# Patient Record
Sex: Female | Born: 1995 | Race: White | Hispanic: No | Marital: Single | Smoking: Former smoker
Health system: Southern US, Community
[De-identification: ages and names within clinical notes are randomized; demographics above are authoritative.]

## PROBLEM LIST (undated history)

## (undated) DIAGNOSIS — O139 Gestational [pregnancy-induced] hypertension without significant proteinuria, unspecified trimester: Secondary | ICD-10-CM

## (undated) DIAGNOSIS — R319 Hematuria, unspecified: Secondary | ICD-10-CM

## (undated) DIAGNOSIS — O00102 Left tubal pregnancy without intrauterine pregnancy: Secondary | ICD-10-CM

## (undated) DIAGNOSIS — I10 Essential (primary) hypertension: Secondary | ICD-10-CM

## (undated) HISTORY — DX: Left tubal pregnancy without intrauterine pregnancy: O00.102

## (undated) HISTORY — PX: TONSILLECTOMY: SUR1361

---
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
## 2015-01-16 ENCOUNTER — Encounter (HOSPITAL_COMMUNITY): Payer: Self-pay | Admitting: Emergency Medicine

## 2015-01-16 ENCOUNTER — Emergency Department (HOSPITAL_COMMUNITY)
Admission: EM | Admit: 2015-01-16 | Discharge: 2015-01-16 | Discharge status: Against Medical Advice | Payer: BLUE CROSS/BLUE SHIELD | Attending: Emergency Medicine | Admitting: Emergency Medicine

## 2015-01-16 ENCOUNTER — Emergency Department (HOSPITAL_COMMUNITY): Payer: Self-pay

## 2015-01-16 DIAGNOSIS — R102 Pelvic and perineal pain: Secondary | ICD-10-CM | POA: Insufficient documentation

## 2015-01-16 DIAGNOSIS — F17210 Nicotine dependence, cigarettes, uncomplicated: Secondary | ICD-10-CM | POA: Insufficient documentation

## 2015-01-16 DIAGNOSIS — O99331 Smoking (tobacco) complicating pregnancy, first trimester: Secondary | ICD-10-CM | POA: Insufficient documentation

## 2015-01-16 DIAGNOSIS — O9989 Other specified diseases and conditions complicating pregnancy, childbirth and the puerperium: Secondary | ICD-10-CM | POA: Insufficient documentation

## 2015-01-16 DIAGNOSIS — Z3A00 Weeks of gestation of pregnancy not specified: Secondary | ICD-10-CM | POA: Insufficient documentation

## 2015-01-16 NOTE — ED Notes (Signed)
Patient not found in waiting area. Per registration, patient left department

## 2015-01-16 NOTE — ED Notes (Signed)
Patient with positive home pregnancy test 3 days ago. Patient states vaginal bleeding and lower abdominal cramping that started today. Small to moderate amount of dark blood noted today.

## 2015-02-04 ENCOUNTER — Other Ambulatory Visit: Payer: Self-pay | Admitting: Obstetrics and Gynecology

## 2015-02-04 DIAGNOSIS — O3680X0 Pregnancy with inconclusive fetal viability, not applicable or unspecified: Secondary | ICD-10-CM

## 2015-02-05 ENCOUNTER — Encounter: Payer: Self-pay | Admitting: Obstetrics & Gynecology

## 2015-02-05 ENCOUNTER — Other Ambulatory Visit: Payer: Self-pay

## 2015-02-09 ENCOUNTER — Encounter (HOSPITAL_COMMUNITY): Payer: Self-pay | Admitting: Emergency Medicine

## 2015-02-09 ENCOUNTER — Emergency Department (HOSPITAL_COMMUNITY)
Admission: EM | Admit: 2015-02-09 | Discharge: 2015-02-09 | Disposition: A | Discharge status: Home / Self Care | Payer: BLUE CROSS/BLUE SHIELD | Attending: Emergency Medicine | Admitting: Emergency Medicine

## 2015-02-09 DIAGNOSIS — Z91040 Latex allergy status: Secondary | ICD-10-CM | POA: Insufficient documentation

## 2015-02-09 DIAGNOSIS — Z720 Tobacco use: Secondary | ICD-10-CM | POA: Insufficient documentation

## 2015-02-09 DIAGNOSIS — R21 Rash and other nonspecific skin eruption: Secondary | ICD-10-CM | POA: Insufficient documentation

## 2015-02-09 LAB — HCG, QUANTITATIVE, PREGNANCY: hCG, Beta Chain, Quant, S: 1 m[IU]/mL (ref <5)

## 2015-02-09 MED ORDER — DEXAMETHASONE 4 MG PO TABS: 4.0000 mg | ORAL_TABLET | Freq: Two times a day (BID) | ORAL | Status: DC

## 2015-02-09 MED ORDER — DOXYCYCLINE HYCLATE 100 MG PO CAPS: 100.0000 mg | ORAL_CAPSULE | Freq: Two times a day (BID) | ORAL | Status: DC

## 2015-02-09 NOTE — Discharge Instructions (Signed)
Please use Decadron and doxycycline 2 times daily with food for your rash.please see your local dermatologist if not improving.  Your quantitative hCG was 1, not consistent with pregnancy. Please see your GYN to make sure that there are no products of conception left in your reproductive system.

## 2015-02-09 NOTE — ED Notes (Signed)
Patient complaining of generalized rash. States "my whole house has had it. They said it was a staph infection."

## 2015-02-09 NOTE — ED Provider Notes (Signed)
CSN: [id number]     Arrival date & time 02/09/15  1041 History  By signing my name below, I, [name], attest that this documentation has been prepared under the direction and in the presence of [name], PA-C  Electronically Signed: [name], ED Scribe. 02/09/2015. 12:09 PM.   Chief Complaint  Patient presents with  . Rash   Patient is a 19 y.o. female presenting with rash. The history is provided by the patient. No language interpreter was used.  Rash Location:  Leg and face Facial rash location:  Face Leg rash location:  L leg and R leg Quality: redness   Severity:  Mild Onset quality:  Gradual Duration:  3 weeks Timing:  Intermittent Progression:  Unchanged Chronicity:  Recurrent Context: exposure to similar rash   Relieved by:  Nothing Worsened by:  Nothing tried Ineffective treatments:  None tried Associated symptoms: no fever     HPI Comments: Jennifer Harding is a 19 y.o. female who presents to the Emergency Department complaining of a recurrent, mild rash on her bilateral legs and face that started 3 weeks ago, resolved and returned a few days ago. Her roommates have similar rash and told her it was a staph infection. One of her roommates was diagnosed with MRSA. Pt denies fever.   Pt was seen seen in the [facility] ED last month for vaginal bleeding after she had a positive pregnancy bleeding. She was told she had a miscarriage. Pt reports that her bleeding has stopped and she has since had negative pregnancy tests. She is requesting confirmation of pregnancy today.  No PCP  Past Medical History  Diagnosis Date  . Hematuria    Past Surgical History  Procedure Laterality Date  . Tonsillectomy     History reviewed. No pertinent family history. Social History  Substance Use Topics  . Smoking status: Current Every Day Smoker -- 1.00 packs/day for 4 years    Types: Cigarettes  . Smokeless tobacco: Former Neurosurgeon  . Alcohol Use: No   OB History     Gravida Para Term Preterm AB TAB SAB Ectopic Multiple Living   1              Review of Systems  Constitutional: Negative for fever.  Genitourinary: Negative for vaginal bleeding.  Skin: Positive for rash.  All other systems reviewed and are negative.  Allergies  Latex  Home Medications   Prior to Admission medications   Medication Sig Start Date End Date Taking? Authorizing Provider  guaiFENesin (MUCINEX) 600 MG 12 hr tablet Take 600 mg by mouth 2 (two) times daily.    Historical Provider, MD  ibuprofen (ADVIL,MOTRIN) 600 MG tablet Take 1 tablet (600 mg total) by mouth every 6 (six) hours as needed. 08/21/13   Deirdre C Poe, CNM   BP 121/71 mmHg  Pulse 82  Temp(Src) 97.7 F (36.5 C) (Oral)  Resp 16  Ht  (1.702 m)  Wt 180 lb (81.647 kg)  BMI 28.19 kg/m2  SpO2 99%  LMP 01/28/2015 Physical Exam  Constitutional: She appears well-developed and well-nourished. No distress.  HENT:  Head: Normocephalic and atraumatic.  Eyes: Conjunctivae and EOM are normal.  Neck: Neck supple. No tracheal deviation present.  Cardiovascular: Normal rate.   Pulmonary/Chest: Effort normal. No respiratory distress.  Musculoskeletal:  The dorsalis pedis is 2+ bilaterally Capillary refill less than 2 s  Skin: Skin is warm and dry.  Multiple papular red areas below the knee extending to just above the  ankle, some with scabbed top No red streaks The areas are not hot No satellite abscess areas Palms are spared No lesions in the web spaces between the fingers Few raised red papular areas on the upper extremities No red streaks noted No hot areas about the face No red streaks Red raised areas on the upper chest and face No lesions on the back Papular lesions of the left posterior thigh and buttock  Psychiatric: She has a normal mood and affect. Her behavior is normal.  Nursing note and vitals reviewed.   ED Course  Procedures   DIAGNOSTIC STUDIES: Oxygen Saturation is 99% on RA,  normal by my interpretation.    COORDINATION OF CARE: 12:08 PM Discussed treatment plan with pt which includes hCG pregnancy, follow-up with GYN, oral antibiotics and Bactroban. Pt agreed to plan.  Labs Review Labs Reviewed  HCG, QUANTITATIVE, PREGNANCY   I have personally reviewed and evaluated these lab results as part of my medical decision-making.  MDM  Vital signs are well within normal limits. The patient states that she has been exposed to a rash that other members in the house have also had. One of them has been diagnosed with methicillin-resistant staph. The patient will be treated with doxycycline and Decadron. She will follow-up with dermatology if not improving.  The patient had concerns for a threatened miscarriage a few weeks ago. She request to be tested for her pregnancy. Her quantitative hCG was 1. I have discussed the results with the patient in terms which he understands.    Final diagnoses:  None    **I have reviewed nursing notes, vital signs, and all appropriate lab and imaging results for this patient.*  **I personally performed the services described in this documentation, which was scribed in my presence. The recorded information has been reviewed and is accurate.[name], PA-C 02/09/15 1333  Mancel Bale, MD 02/09/15 [phone]

## 2015-05-26 ENCOUNTER — Other Ambulatory Visit: Payer: Self-pay

## 2015-05-27 ENCOUNTER — Ambulatory Visit (INDEPENDENT_AMBULATORY_CARE_PROVIDER_SITE_OTHER): Payer: Medicaid Other

## 2015-05-27 ENCOUNTER — Other Ambulatory Visit: Payer: Self-pay | Admitting: Obstetrics and Gynecology

## 2015-05-27 DIAGNOSIS — O3680X0 Pregnancy with inconclusive fetal viability, not applicable or unspecified: Secondary | ICD-10-CM

## 2015-05-27 NOTE — Progress Notes (Addendum)
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 Emelda Fear.

## 2015-05-28 ENCOUNTER — Telehealth: Payer: Self-pay | Admitting: Obstetrics & Gynecology

## 2015-05-28 NOTE — Telephone Encounter (Signed)
Pt states had initial ultrasound yesterday about [redacted] weeks pregnant, had light bleeding last night into this am, no pain. Vaginal bleeding has stopped.  Pt informed "Subchorionic Hemorrhage" on ultrasound could have been what caused the bleeding to keep her appt for 06/04/2015. Pt advised if heavy bleeding occurs or pain to go to ER. Pt verbalized understanding.

## 2015-05-28 NOTE — Telephone Encounter (Signed)
Pt called stating that she would like a call back from the nurse regarding the ultrasound that she has yesterday. Please contact pt

## 2015-05-29 ENCOUNTER — Inpatient Hospital Stay (HOSPITAL_COMMUNITY)
Admission: AD | Admit: 2015-05-29 | Discharge: 2015-05-29 | Disposition: A | Discharge status: Home / Self Care | Payer: Medicaid Other | Source: Ambulatory Visit | Attending: Obstetrics and Gynecology | Admitting: Obstetrics and Gynecology

## 2015-05-29 ENCOUNTER — Inpatient Hospital Stay (HOSPITAL_COMMUNITY): Payer: Medicaid Other

## 2015-05-29 ENCOUNTER — Encounter (HOSPITAL_COMMUNITY): Payer: Self-pay

## 2015-05-29 DIAGNOSIS — O99011 Anemia complicating pregnancy, first trimester: Secondary | ICD-10-CM | POA: Diagnosis not present

## 2015-05-29 DIAGNOSIS — Z3A01 Less than 8 weeks gestation of pregnancy: Secondary | ICD-10-CM | POA: Diagnosis not present

## 2015-05-29 DIAGNOSIS — O034 Incomplete spontaneous abortion without complication: Secondary | ICD-10-CM | POA: Diagnosis not present

## 2015-05-29 DIAGNOSIS — O99331 Smoking (tobacco) complicating pregnancy, first trimester: Secondary | ICD-10-CM | POA: Insufficient documentation

## 2015-05-29 DIAGNOSIS — N939 Abnormal uterine and vaginal bleeding, unspecified: Secondary | ICD-10-CM | POA: Diagnosis present

## 2015-05-29 LAB — CBC
HCT: 31.6 % — ABNORMAL LOW (ref 36.0–46.0)
Hemoglobin: 10.5 g/dL — ABNORMAL LOW (ref 12.0–15.0)
MCH: 29.2 pg (ref 26.0–34.0)
MCHC: 33.2 g/dL (ref 30.0–36.0)
MCV: 88 fL (ref 78.0–100.0)
PLATELETS: 337 10*3/uL (ref 150–400)
RBC: 3.59 MIL/uL — AB (ref 3.87–5.11)
RDW: 13.5 % (ref 11.5–15.5)
WBC: 21.3 10*3/uL — AB (ref 4.0–10.5)

## 2015-05-29 MED ORDER — IBUPROFEN 600 MG PO TABS: 600.0000 mg | ORAL_TABLET | Freq: Four times a day (QID) | ORAL | Status: DC | PRN

## 2015-05-29 MED ORDER — OXYCODONE-ACETAMINOPHEN 5-325 MG PO TABS: 1.0000 | ORAL_TABLET | ORAL | Status: DC | PRN

## 2015-05-29 MED ORDER — IBUPROFEN 600 MG PO TABS
600.0000 mg | ORAL_TABLET | Freq: Once | ORAL | Status: AC
  Administered 2015-05-29: 600 mg via ORAL
  Filled 2015-05-29: qty 1

## 2015-05-29 MED ORDER — MISOPROSTOL 200 MCG PO TABS
800.0000 ug | ORAL_TABLET | Freq: Once | ORAL | Status: AC
  Administered 2015-05-29: 800 ug via ORAL
  Filled 2015-05-29: qty 4

## 2015-05-29 MED ORDER — OXYCODONE-ACETAMINOPHEN 5-325 MG PO TABS
1.0000 | ORAL_TABLET | Freq: Once | ORAL | Status: AC
  Administered 2015-05-29: 1 via ORAL
  Filled 2015-05-29: qty 1

## 2015-05-29 MED ORDER — ONDANSETRON 8 MG PO TBDP
8.0000 mg | ORAL_TABLET | Freq: Once | ORAL | Status: AC
  Administered 2015-05-29: 8 mg via ORAL
  Filled 2015-05-29: qty 1

## 2015-05-29 NOTE — Discharge Instructions (Signed)
Incomplete Miscarriage A miscarriage is the sudden loss of an unborn baby (fetus) before the 20th week of pregnancy. In an incomplete miscarriage, parts of the fetus or placenta (afterbirth) remain in the body.  Having a miscarriage can be an emotional experience. Talk with your health care provider about any questions you may have about miscarrying, the grieving process, and your future pregnancy plans. CAUSES   Problems with the fetal chromosomes that make it impossible for the baby to develop normally. Problems with the baby's genes or chromosomes are most often the result of errors that occur by chance as the embryo divides and grows. The problems are not inherited from the parents.  Infection of the cervix or uterus.  Hormone problems.  Problems with the cervix, such as having an incompetent cervix. This is when the tissue in the cervix is not strong enough to hold the pregnancy.  Problems with the uterus, such as an abnormally shaped uterus, uterine fibroids, or congenital abnormalities.  Certain medical conditions.  Smoking, drinking alcohol, or taking illegal drugs.  Trauma. SYMPTOMS   Vaginal bleeding or spotting, with or without cramps or pain.  Pain or cramping in the abdomen or lower back.  Passing fluid, tissue, or blood clots from the vagina. DIAGNOSIS  Your health care provider will perform a physical exam. You may also have an ultrasound to confirm the miscarriage. Blood or urine tests may also be ordered. TREATMENT   Usually, a dilation and curettage (D&C) procedure is performed. During a D&C procedure, the cervix is widened (dilated) and any remaining fetal or placental tissue is gently removed from the uterus.  Antibiotic medicines are prescribed if there is an infection. Other medicines may be given to reduce the size of the uterus (contract) if there is a lot of bleeding.  If you have Rh negative blood and your baby was Rh positive, you will need a Rho (D)  immune globulin shot. This shot will protect any future baby from having Rh blood problems in future pregnancies.  You may be confined to bed rest. This means you should stay in bed and only get up to use the bathroom. HOME CARE INSTRUCTIONS   Rest as directed by your health care provider.  Restrict activity as directed by your health care provider. You may be allowed to continue light activity if curettage was not done but you require further treatment.  Keep track of the number of pads you use each day. Keep track of how soaked (saturated) they are. Record this information.  Do not  use tampons.  Do not douche or have sexual intercourse until approved by your health care provider.  Keep all follow-up appointments for reevaluation and continuing management.  Only take over-the-counter or prescription medicines for pain, fever, or discomfort as directed by your health care provider.  Take antibiotic medicine as directed by your health care provider. Make sure you finish it even if you start to feel better. SEEK IMMEDIATE MEDICAL CARE IF:   You experience severe cramps in your stomach, back, or abdomen.  You have an unexplained temperature (make sure to record these temperatures).  You pass large clots or tissue (save these for your health care provider to inspect).  Your bleeding increases.  You become light-headed, weak, or have fainting episodes. MAKE SURE YOU:   Understand these instructions.  Will watch your condition.  Will get help right away if you are not doing well or get worse.   This information is not intended to   replace advice given to you by your health care provider. Make sure you discuss any questions you have with your health care provider.   Document Released: 04/25/2005 Document Revised: 05/16/2014 Document Reviewed: 11/22/2012 Elsevier Interactive Patient Education 2016 Elsevier Inc.  

## 2015-05-29 NOTE — MAU Provider Note (Signed)
Chief Complaint: Vaginal Bleeding   First Provider Initiated Contact with Patient 05/29/15 1945        SUBJECTIVE  HPI HPI: Jennifer Harding is a 20 y.o. G1P0 at [redacted]w[redacted]d by LMP who presents to maternity admissions reporting heavy bleeding with miscarriage.  Went to [facility] and they tried to send her to [facility], who sent her here. .She got IV fluids and labs there. She denies vaginal itching/burning, urinary symptoms, h/a, dizziness, n/v, or fever/chills.     Past Medical History  Diagnosis Date  . Hematuria    Past Surgical History  Procedure Laterality Date  . Tonsillectomy     Social History   Social History  . Marital Status: Single    Spouse Name: N/A  . Number of Children: N/A  . Years of Education: N/A   Occupational History  . Not on file.   Social History Main Topics  . Smoking status: Current Every Day Smoker -- 1.00 packs/day for 4 years    Types: Cigarettes  . Smokeless tobacco: Former Neurosurgeon  . Alcohol Use: No  . Drug Use: No  . Sexual Activity: Yes    Birth Control/ Protection: None   Other Topics Concern  . Not on file   Social History Narrative   No current facility-administered medications on file prior to encounter.   Current Outpatient Prescriptions on File Prior to Encounter  Medication Sig Dispense Refill  . dexamethasone (DECADRON) 4 MG tablet Take 1 tablet (4 mg total) by mouth 2 (two) times daily with a meal. (Patient not taking: Reported on 05/29/2015) 12 tablet 0   Allergies  Allergen Reactions  . Latex Rash    ROS:  Review of Systems Review of Systems  Constitutional: Negative for fever and chills.  Gastrointestinal: Negative for nausea, vomiting, abdominal pain, diarrhea and constipation.  Genitourinary: Negative for dysuria. + for vaginal bleeidng and pelvic pain Musculoskeletal: Negative for back pain.  Neurological: Negative for dizziness and weakness.  I have reviewed patient's Past Medical Hx, Surgical Hx, Family  Hx, Social Hx, medications and allergies.   Physical Exam  Patient Vitals for the past 24 hrs:  BP Temp Temp src Pulse Resp  05/29/15 1930 121/73 mmHg 98.6 F (37 C) Oral 102 18    Physical Exam  Constitutional: Well-developed, well-nourished female in no acute distress.  Cardiovascular: normal rate and rhythm Respiratory: normal effort GI: Abd soft, non-tender. Pos BS x 4 MS: Extremities nontender, no edema, normal ROM Neurologic: Alert and oriented x 4.  GU: Neg CVAT.  PELVIC EXAM: Cervix 1/long/high, firm, anterior, neg CMT, uterus tender, nonenlarged, adnexa without tenderness, enlargement, or mass        Small to moderate vaginal bleeding now   LAB RESULTS Results for orders placed or performed during the hospital encounter of 05/29/15 (from the past 24 hour(s))  ABO/Rh     Status: None (Preliminary result)   Collection Time: 05/29/15  8:00 PM  Result Value Ref Range   ABO/RH(D) O POS   CBC     Status: Abnormal   Collection Time: 05/29/15  8:00 PM  Result Value Ref Range   WBC 21.3 (H) 4.0 - 10.5 K/uL   RBC 3.59 (L) 3.87 - 5.11 MIL/uL   Hemoglobin 10.5 (L) 12.0 - 15.0 g/dL   HCT 16.1 (L) 09.6 - 04.5 %   MCV 88.0 78.0 - 100.0 fL   MCH 29.2 26.0 - 34.0 pg   MCHC 33.2 30.0 - 36.0 g/dL  RDW 13.5 11.5 - 15.5 %   Platelets 337 150 - 400 K/uL   Hgb was 12 at other ED.    IMAGING US Ob Comp Less 14 Wks  05/27/2015  DATING AND VIABILITY SONOGRAM Jennifer Harding is a 20 y.o. year old G1P0 with LMP 03/04/2015 which would correlate to  [redacted] weeks gestation.  She has irregular menstrual cycles.   She is here today for a confirmatory initial sonogram. GESTATION: SINGLETON   FETAL ACTIVITY:          Heart rate         No fht seen     CERVIX: Appears closed ADNEXA: The ovaries are normal. GESTATIONAL AGE AND  BIOMETRICS: Gestational criteria: Estimated Date of Delivery: 12/09/2015. 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 Emelda Fear. A copy of this report including all images has been saved and backed up to a second source for retrieval if needed. All measures and details of the anatomical scan, placentation, fluid volume and pelvic anatomy are contained in that report. Jennifer Harding 05/27/2015 2:19 PM   US Ob Transvaginal  05/29/2015  CLINICAL DATA:  Bleeding, first trimester pregnancy, evaluate for viability EXAM: TRANSVAGINAL OB ULTRASOUND TECHNIQUE: Transvaginal ultrasound was performed for complete evaluation of the gestation as well as the maternal uterus, adnexal regions, and pelvic cul-de-sac. COMPARISON:  05/27/2015 FINDINGS: Intrauterine gestational sac: Single gestational sac identified within the cervical canal Yolk sac:  Yolk sac identified within the gestational sac Embryo:  None Cardiac Activity: None CRL:   16  mm   6 w 3 d Subchorionic hemorrhage:  None visualized. Maternal uterus/adnexae: Right ovary appears normal. Left ovary not visualized. IMPRESSION: Gestational sac identified within the cervical canal consistent with abortion in progress. Electronically Signed   By: Esperanza Heir M.D.   On: 05/29/2015 21:44   US Ob Transvaginal  05/27/2015  DATING AND VIABILITY SONOGRAM Jennifer Harding is a 20 y.o. year old G1P0 with LMP 03/04/2015 which would correlate to  [redacted] weeks gestation.  She has irregular menstrual cycles.   She is here today for a confirmatory initial sonogram. GESTATION: SINGLETON   FETAL ACTIVITY:          Heart rate         No fht seen     CERVIX: Appears closed ADNEXA: The ovaries are normal. GESTATIONAL  AGE AND  BIOMETRICS: Gestational criteria: Estimated Date of Delivery: 12/09/2015. 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 Emelda Fear. A copy of this report including all images has been saved and backed up to a second source for retrieval if needed. All measures and details of the anatomical scan, placentation, fluid volume and pelvic anatomy are contained in that report. Jennifer Harding 05/27/2015 2:19 PM    MAU Management/MDM: Ordered labs and reviewed results.   Consult Dr Jolayne Panther.  US done to confirm product of conception.  GS seen in cervical canal. Per Dr Jolayne Panther, she needs one dose of Cytotec. Pt stable at time of discharge.    ASSESSMENT SIUP at [redacted]w[redacted]d  Incomplete but inevitable SAB Mild anemia  PLAN Discharge home Prior to discharge, will give Cytotec PV WIll give Percocet, ibuprofen and zofran prior to discharge.  Bleeding precautions Discussed potential for short term heavy bleeding which should stop after contents passed.     Medication List    ASK your doctor about these medications        dexamethasone 4 MG tablet  Commonly known as:  DECADRON  Take 1 tablet (4 mg total) by mouth 2 (two) times daily with a meal.     prenatal multivitamin Tabs tablet  Take 1 tablet by mouth daily at 12 noon.         Wynelle Bourgeois CNM, MSN Certified Nurse-Midwife 05/29/2015  8:05 PM

## 2015-05-29 NOTE — MAU Note (Signed)
Pt presents via EMS for vaginal bleeding. Was having heavy bleeding with clots earlier but it has stopped. On and off pain.

## 2015-05-30 LAB — ABO/RH: ABO/RH(D): O POS

## 2015-06-04 ENCOUNTER — Encounter: Payer: Self-pay | Admitting: Obstetrics and Gynecology

## 2015-06-04 ENCOUNTER — Encounter: Payer: Medicaid Other | Admitting: Obstetrics and Gynecology

## 2015-06-04 ENCOUNTER — Other Ambulatory Visit: Payer: Medicaid Other

## 2016-03-22 DIAGNOSIS — O321XX0 Maternal care for breech presentation, not applicable or unspecified: Secondary | ICD-10-CM

## 2016-04-02 ENCOUNTER — Encounter (HOSPITAL_COMMUNITY): Payer: Self-pay

## 2017-03-08 IMAGING — US US OB TRANSVAGINAL
1 series · 15 of 28 positions shown · non-contrast
Comparison: 05/27/2015

CLINICAL DATA: Bleeding, first trimester pregnancy, evaluate for
viability

EXAM:
TRANSVAGINAL OB ULTRASOUND
TECHNIQUE: Transvaginal ultrasound was performed for complete evaluation of the
gestation as well as the maternal uterus, adnexal regions, and
pelvic cul-de-sac.

[Series 1: us ob transvaginal · 15 of 29 slices shown]
[im 1/29]
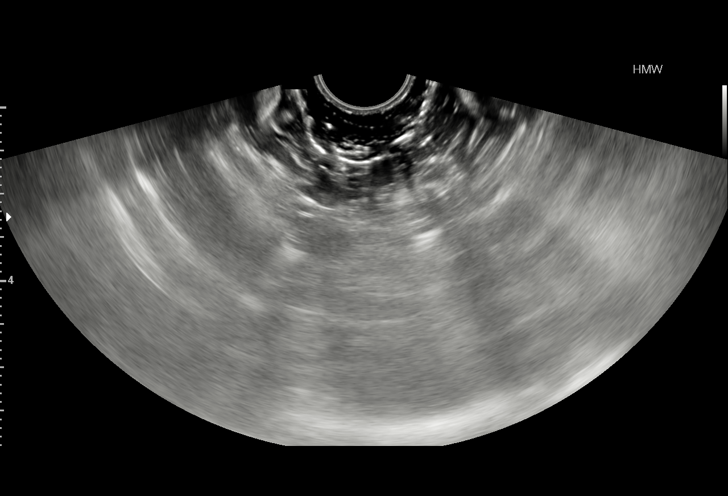
[im 3/29]
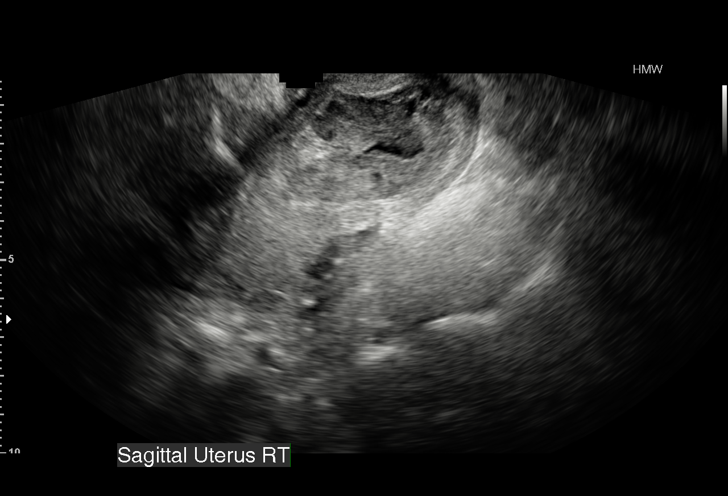
[im 5/29]
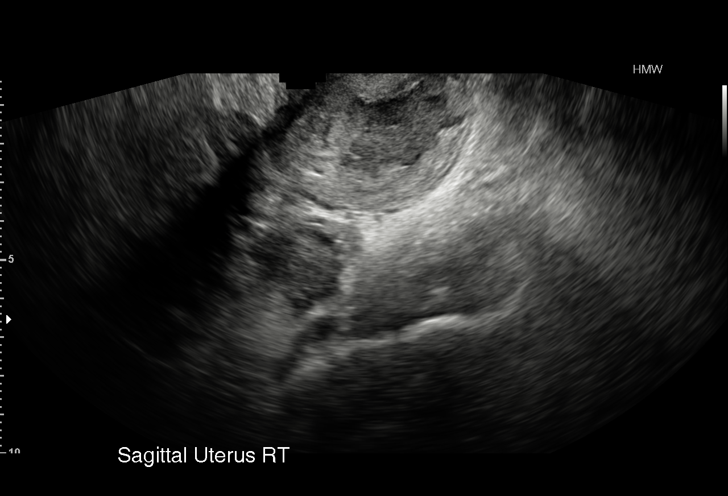
[im 7/29]
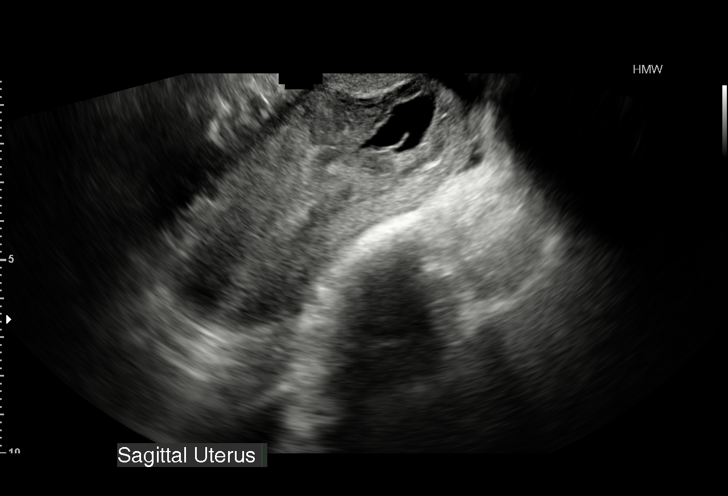
[im 9/29]
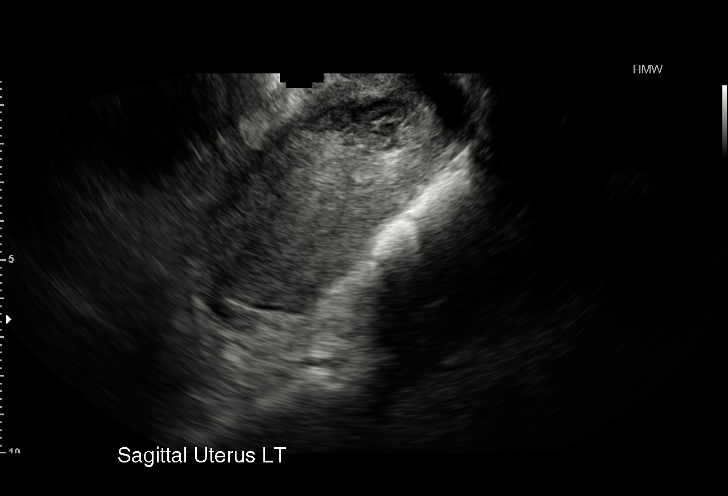
[im 11/29]
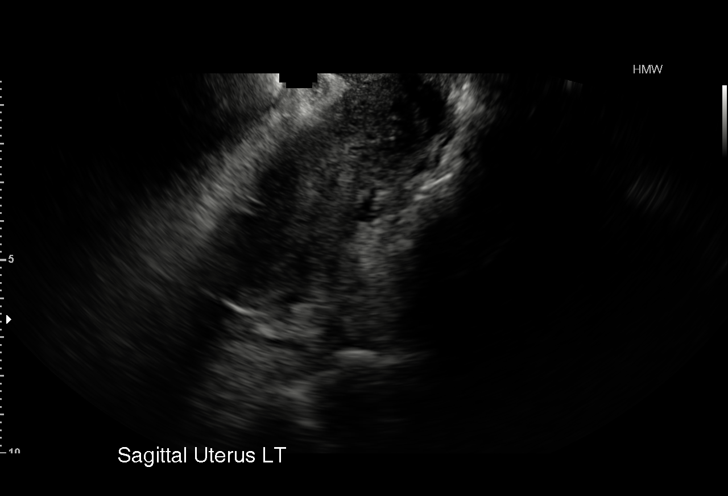
[im 13/29]
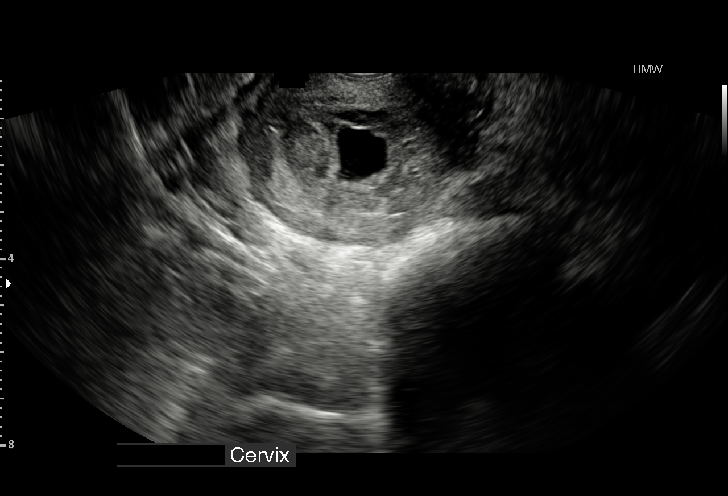
[im 15/29]
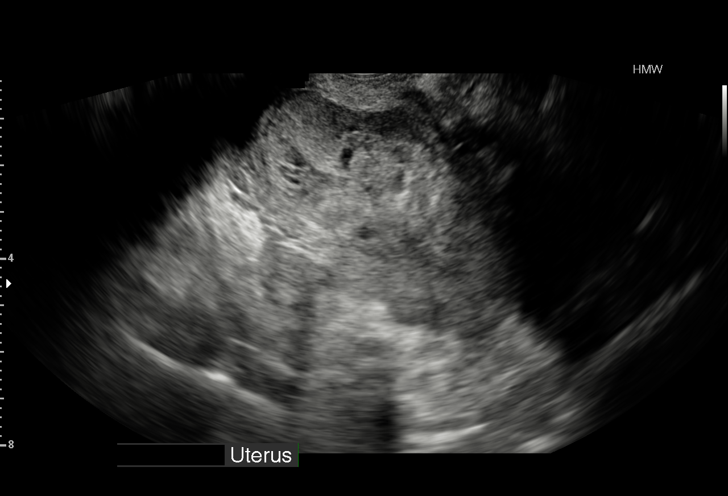
[im 16/29]
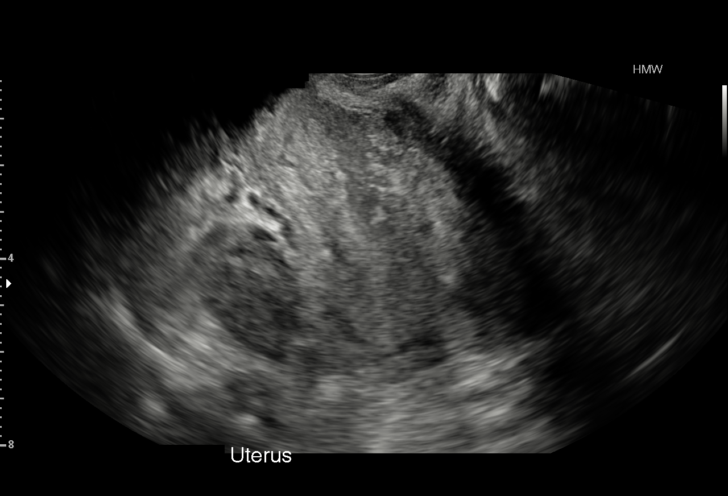
[im 18/29]
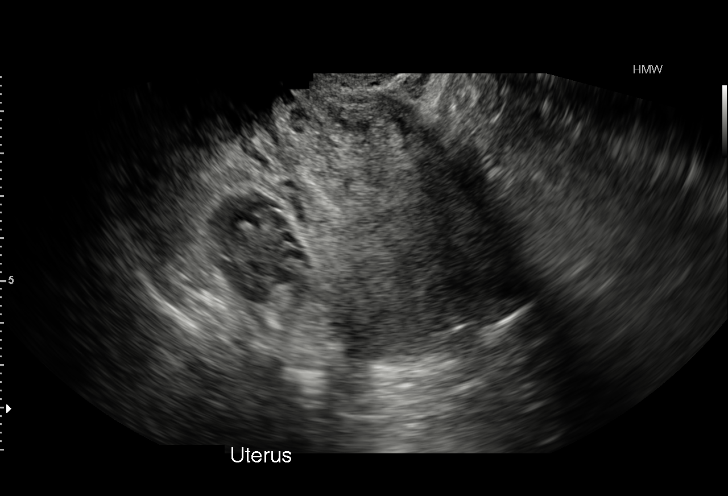
[im 20/29]
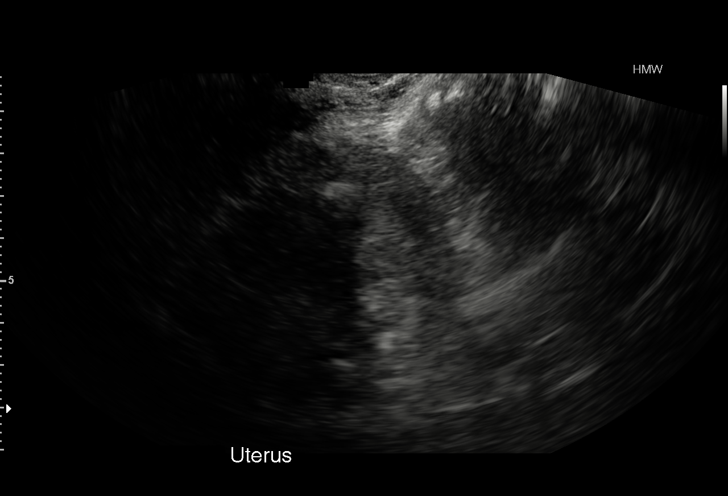
[im 22/29]
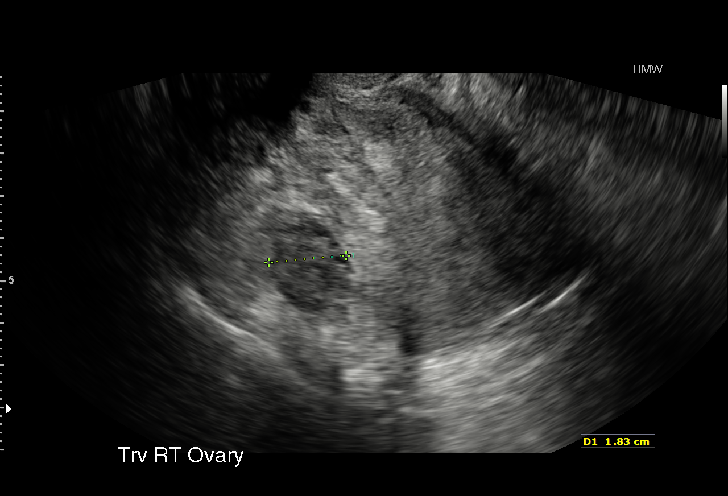
[im 24/29]
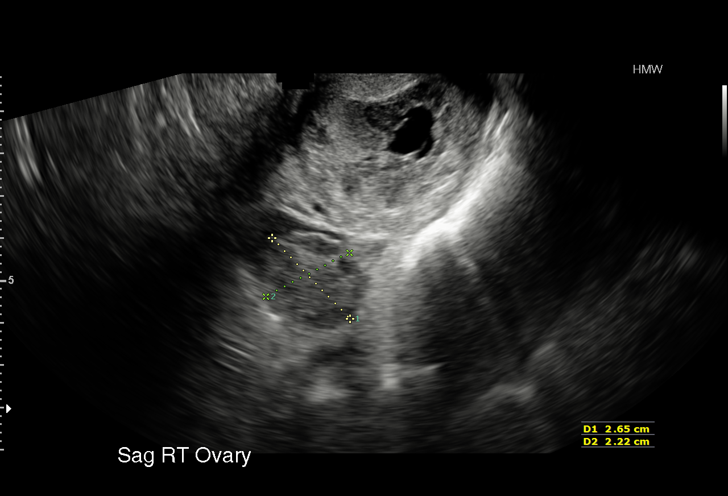
[im 26/29]
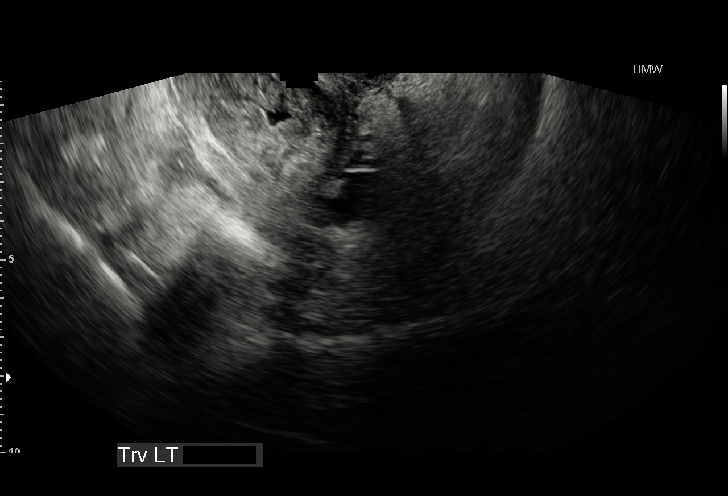
[im 29/29]
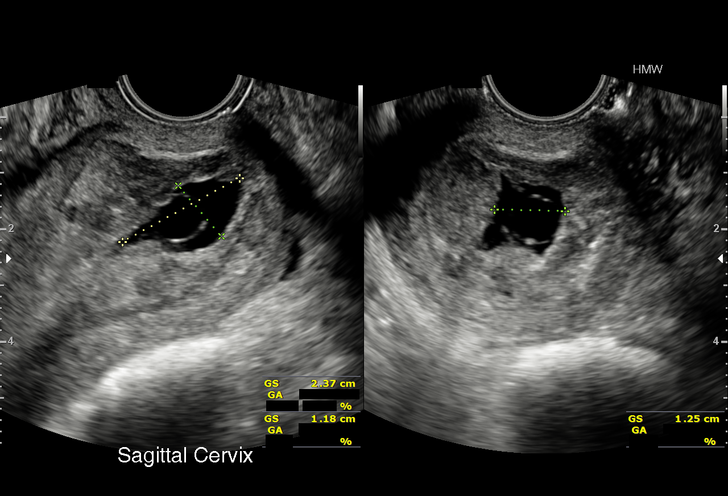

[15 of 28 positions shown; findings below may reference images not displayed]

FINDINGS: Intrauterine gestational sac: Single gestational sac identified
within the cervical canal

Yolk sac:  Yolk sac identified within the gestational sac

Embryo:  None

Cardiac Activity: None

CRL:   16  mm   6 w 3 d

Subchorionic hemorrhage:  None visualized.

Maternal uterus/adnexae: Right ovary appears normal. Left ovary not
visualized.
IMPRESSION: Gestational sac identified within the cervical canal consistent with
abortion in progress.

## 2018-04-06 ENCOUNTER — Encounter (HOSPITAL_COMMUNITY): Payer: Self-pay | Admitting: *Deleted

## 2018-04-06 ENCOUNTER — Inpatient Hospital Stay (HOSPITAL_COMMUNITY)
Admission: AD | Admit: 2018-04-06 | Discharge: 2018-04-06 | Disposition: A | Discharge status: Home / Self Care | Payer: Medicaid Other | Source: Ambulatory Visit | Attending: Obstetrics & Gynecology | Admitting: Obstetrics & Gynecology

## 2018-04-06 ENCOUNTER — Inpatient Hospital Stay (HOSPITAL_COMMUNITY): Payer: Medicaid Other

## 2018-04-06 ENCOUNTER — Other Ambulatory Visit: Payer: Self-pay

## 2018-04-06 DIAGNOSIS — O0281 Inappropriate change in quantitative human chorionic gonadotropin (hCG) in early pregnancy: Secondary | ICD-10-CM

## 2018-04-06 DIAGNOSIS — Q513 Bicornate uterus: Secondary | ICD-10-CM | POA: Diagnosis not present

## 2018-04-06 DIAGNOSIS — O4691 Antepartum hemorrhage, unspecified, first trimester: Secondary | ICD-10-CM | POA: Diagnosis not present

## 2018-04-06 DIAGNOSIS — Z91040 Latex allergy status: Secondary | ICD-10-CM | POA: Insufficient documentation

## 2018-04-06 DIAGNOSIS — Z3A15 15 weeks gestation of pregnancy: Secondary | ICD-10-CM | POA: Diagnosis not present

## 2018-04-06 DIAGNOSIS — O3402 Maternal care for unspecified congenital malformation of uterus, second trimester: Secondary | ICD-10-CM | POA: Insufficient documentation

## 2018-04-06 DIAGNOSIS — O34219 Maternal care for unspecified type scar from previous cesarean delivery: Secondary | ICD-10-CM | POA: Diagnosis not present

## 2018-04-06 DIAGNOSIS — O99332 Smoking (tobacco) complicating pregnancy, second trimester: Secondary | ICD-10-CM | POA: Diagnosis not present

## 2018-04-06 DIAGNOSIS — Z79899 Other long term (current) drug therapy: Secondary | ICD-10-CM | POA: Insufficient documentation

## 2018-04-06 DIAGNOSIS — O209 Hemorrhage in early pregnancy, unspecified: Secondary | ICD-10-CM | POA: Diagnosis present

## 2018-04-06 DIAGNOSIS — O4690 Antepartum hemorrhage, unspecified, unspecified trimester: Secondary | ICD-10-CM

## 2018-04-06 DIAGNOSIS — O3680X0 Pregnancy with inconclusive fetal viability, not applicable or unspecified: Secondary | ICD-10-CM | POA: Diagnosis not present

## 2018-04-06 LAB — URINALYSIS, ROUTINE W REFLEX MICROSCOPIC
Bilirubin Urine: NEGATIVE
GLUCOSE, UA: NEGATIVE mg/dL
Ketones, ur: NEGATIVE mg/dL
Leukocytes, UA: NEGATIVE
Nitrite: NEGATIVE
PH: 7 (ref 5.0–8.0)
PROTEIN: NEGATIVE mg/dL
Specific Gravity, Urine: 1.012 (ref 1.005–1.030)

## 2018-04-06 LAB — HCG, QUANTITATIVE, PREGNANCY: HCG, BETA CHAIN, QUANT, S: 427 m[IU]/mL — AB (ref <5)

## 2018-04-06 LAB — WET PREP, GENITAL
Sperm: NONE SEEN
TRICH WET PREP: NONE SEEN
YEAST WET PREP: NONE SEEN

## 2018-04-06 LAB — POCT PREGNANCY, URINE: Preg Test, Ur: POSITIVE — AB

## 2018-04-06 NOTE — MAU Provider Note (Signed)
First Provider Initiated Contact with Patient 04/06/18 1047      Chief Complaint:  Vaginal Bleeding and Abdominal Pain   Elnita MaxwellBrienne Poet is  22 y.o. G2P0 at 707w0d by LMP presents complaining of Vaginal Bleeding and Abdominal Pain Had + UPT 4 days ago, no US yet.   States that she noticed some red blood when wiping after a BM this am.  Very sure that it wasn't rectal.  Has also had some mild cramping. No pain.  Intercourse day before yesterday.   Obstetrical/Gynecological History: OB History    Gravida  2   Para  1   Term  1   Preterm      AB      Living        SAB      TAB      Ectopic      Multiple      Live Births             Past Medical History: Past Medical History:  Diagnosis Date  . Hematuria     Past Surgical History: Past Surgical History:  Procedure Laterality Date  . CESAREAN SECTION    . TONSILLECTOMY      Family History: History reviewed. No pertinent family history.  Social History: Social History   Tobacco Use  . Smoking status: Current Every Day Smoker    Packs/day: 1.00    Years: 4.00    Pack years: 4.00    Types: Cigarettes  . Smokeless tobacco: Former Engineer, waterUser  Substance Use Topics  . Alcohol use: No  . Drug use: No    Allergies:  Allergies  Allergen Reactions  . Latex Rash    Meds:  Medications Prior to Admission  Medication Sig Dispense Refill Last Dose  . ibuprofen (ADVIL,MOTRIN) 600 MG tablet Take 1 tablet (600 mg total) by mouth every 6 (six) hours as needed for moderate pain. 30 tablet 0   . oxyCODONE-acetaminophen (PERCOCET/ROXICET) 5-325 MG tablet Take 1 tablet by mouth every 4 (four) hours as needed for moderate pain or severe pain. 10 tablet 0   . Prenatal Vit-Fe Fumarate-FA (PRENATAL MULTIVITAMIN) TABS tablet Take 1 tablet by mouth daily at 12 noon.   05/28/2015 at Unknown time    Review of Systems   Constitutional: Negative for fever and chills Eyes: Negative for visual disturbances Respiratory:  Negative for shortness of breath, dyspnea Cardiovascular: Negative for chest pain or palpitations  Gastrointestinal: Negative for vomiting, diarrhea and constipation Genitourinary: Negative for dysuria and urgency Musculoskeletal: Negative for back pain, joint pain, myalgias.  Normal ROM  Neurological: Negative for dizziness and headaches    Physical Exam  Blood pressure 133/68, pulse 91, temperature 98.3 F (36.8 C), temperature source Oral, resp. rate 18, last menstrual period 12/22/2017, SpO2 100 %, unknown if currently breastfeeding. GENERAL: Well-developed, well-nourished female in no acute distress.  LUNGS: Normal respiratory effort HEART: Regular rate and rhythm. ABDOMEN: Soft, nontender, nondistended EXTREMITIES: Nontender, no edema, 2+ distal pulses. DTR's 2+ PELVIC:  SSE:  Small amount of dark red blood in vagina.  Cx non friable  Discharge appears normal w/o odor. Digital cx exam reveals firm, LTC cx.  \  Labs: Results for orders placed or performed during the hospital encounter of 04/06/18 (from the past 24 hour(s))  Urinalysis, Routine w reflex microscopic   Collection Time: 04/06/18 10:37 AM  Result Value Ref Range   Color, Urine YELLOW YELLOW   APPearance HAZY (A) CLEAR   Specific Gravity, Urine  1.012 1.005 - 1.030   pH 7.0 5.0 - 8.0   Glucose, UA NEGATIVE NEGATIVE mg/dL   Hgb urine dipstick LARGE (A) NEGATIVE   Bilirubin Urine NEGATIVE NEGATIVE   Ketones, ur NEGATIVE NEGATIVE mg/dL   Protein, ur NEGATIVE NEGATIVE mg/dL   Nitrite NEGATIVE NEGATIVE   Leukocytes, UA NEGATIVE NEGATIVE   RBC / HPF 0-5 0 - 5 RBC/hpf   WBC, UA 0-5 0 - 5 WBC/hpf   Bacteria, UA RARE (A) NONE SEEN   Squamous Epithelial / LPF 0-5 0 - 5   Mucus PRESENT   Pregnancy, urine POC   Collection Time: 04/06/18 10:40 AM  Result Value Ref Range   Preg Test, Ur POSITIVE (A) NEGATIVE  Wet prep, genital   Collection Time: 04/06/18 10:55 AM  Result Value Ref Range   Yeast Wet Prep HPF POC NONE  SEEN NONE SEEN   Trich, Wet Prep NONE SEEN NONE SEEN   Clue Cells Wet Prep HPF POC PRESENT (A) NONE SEEN   WBC, Wet Prep HPF POC MODERATE (A) NONE SEEN   Sperm NONE SEEN    Imaging Studies:  No gest sac,  Assessment: Artina Minella is  22 y.o. G2P1 w/pg of unknown location and viability  Differentials include: Early pg:  IU vs ectopic Failing pg Postcoital bleeding vs implantation bleeding . Although there are few clue cells, pt is asymptomatic and cx wasn't friable so risks of meds in probable early pg don't outweigh benefits of treatment  Plan: F/U Sunday for repeat HCG.  Ectopic precautions   Jacklyn Shell 11/29/201912:05 PM\

## 2018-04-06 NOTE — MAU Note (Signed)
Is 14 wks, been having a little bit of cramping and some spotting. Started this morning. 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  [facility]

## 2018-04-06 NOTE — Discharge Instructions (Signed)
Vaginal Bleeding During Pregnancy, First Trimester °A small amount of bleeding (spotting) from the vagina is common in early pregnancy. Sometimes the bleeding is normal and is not a problem, and sometimes it is a sign of something serious. Be sure to tell your doctor about any bleeding from your vagina right away. °Follow these instructions at home: °· Watch your condition for any changes. °· Follow your doctor's instructions about how active you can be. °· If you are on bed rest: °? You may need to stay in bed and only get up to use the bathroom. °? You may be allowed to do some activities. °? If you need help, make plans for someone to help you. °· Write down: °? The number of pads you use each day. °? How often you change pads. °? How soaked (saturated) your pads are. °· Do not use tampons. °· Do not douche. °· Do not have sex or orgasms until your doctor says it is okay. °· If you pass any tissue from your vagina, save the tissue so you can show it to your doctor. °· Only take medicines as told by your doctor. °· Do not take aspirin because it can make you bleed. °· Keep all follow-up visits as told by your doctor. °Contact a doctor if: °· You bleed from your vagina. °· You have cramps. °· You have labor pains. °· You have a fever that does not go away after you take medicine. °Get help right away if: °· You have very bad cramps in your back or belly (abdomen). °· You pass large clots or tissue from your vagina. °· You bleed more. °· You feel light-headed or weak. °· You pass out (faint). °· You have chills. °· You are leaking fluid or have a gush of fluid from your vagina. °· You pass out while pooping (having a bowel movement). °This information is not intended to replace advice given to you by your health care provider. Make sure you discuss any questions you have with your health care provider. °Document Released: 09/09/2013 Document Revised: 10/01/2015 Document Reviewed: 12/31/2012 °Elsevier Interactive  Patient Education © 2018 Elsevier Inc. ° °

## 2018-04-08 ENCOUNTER — Inpatient Hospital Stay (HOSPITAL_COMMUNITY)
Admit: 2018-04-08 | Discharge: 2018-04-08 | Disposition: A | Discharge status: Home / Self Care | Payer: Medicaid Other | Source: Ambulatory Visit | Attending: Obstetrics & Gynecology | Admitting: Obstetrics & Gynecology

## 2018-04-08 ENCOUNTER — Encounter (HOSPITAL_COMMUNITY): Payer: Self-pay | Admitting: Student

## 2018-04-08 DIAGNOSIS — O3680X0 Pregnancy with inconclusive fetal viability, not applicable or unspecified: Secondary | ICD-10-CM

## 2018-04-08 DIAGNOSIS — O0281 Inappropriate change in quantitative human chorionic gonadotropin (hCG) in early pregnancy: Secondary | ICD-10-CM | POA: Diagnosis present

## 2018-04-08 LAB — HCG, QUANTITATIVE, PREGNANCY: hCG, Beta Chain, Quant, S: 647 m[IU]/mL — ABNORMAL HIGH (ref <5)

## 2018-04-08 NOTE — MAU Note (Signed)
Here for follow up HCG  No bleeding  Some cramping, 1/10, intermittent

## 2018-04-08 NOTE — Discharge Instructions (Signed)
Return to care  If you have heavier bleeding that soaks through more that 2 pads per hour for an hour or more If you bleed so much that you feel like you might pass out or you do pass out If you have significant abdominal pain that is not improved with Tylenol   

## 2018-04-09 LAB — GC/CHLAMYDIA PROBE AMP (~~LOC~~) NOT AT ARMC
CHLAMYDIA, DNA PROBE: NEGATIVE
NEISSERIA GONORRHEA: NEGATIVE

## 2018-04-10 ENCOUNTER — Other Ambulatory Visit: Payer: Medicaid Other

## 2018-04-11 ENCOUNTER — Ambulatory Visit: Payer: Medicaid Other | Admitting: *Deleted

## 2018-04-11 DIAGNOSIS — O3680X0 Pregnancy with inconclusive fetal viability, not applicable or unspecified: Secondary | ICD-10-CM

## 2018-04-11 LAB — HCG, QUANTITATIVE, PREGNANCY: HCG, BETA CHAIN, QUANT, S: 1148 m[IU]/mL — AB (ref <5)

## 2018-04-11 NOTE — Progress Notes (Signed)
Here for stat bhcg.States per [facility] thought she was 14 weeks or so by LMP-but states she has irregular periods.  Denies pain.  Denies bleeding. Explained we will draw stat bhcg and have her wait in lobby for results which we will then discuss with provider and then her. She voices understanding.  Betsi Crespi,RN

## 2018-04-11 NOTE — Progress Notes (Signed)
Reviewed results with Dr. Shawnie PonsPratt. Informed patient her levels did rise but can not verify was appropriate since has been 3 days . Recommend US next week and to follow ectopic precautions. She voices understanding.  Kamea Dacosta,RN

## 2018-04-17 ENCOUNTER — Encounter (HOSPITAL_COMMUNITY): Payer: Self-pay | Admitting: *Deleted

## 2018-04-17 ENCOUNTER — Encounter (HOSPITAL_COMMUNITY)
Admission: AD | Disposition: A | Discharge status: Home / Self Care | Payer: Self-pay | Source: Home / Self Care | Attending: Obstetrics & Gynecology

## 2018-04-17 ENCOUNTER — Inpatient Hospital Stay (HOSPITAL_COMMUNITY): Payer: Medicaid Other | Admitting: Anesthesiology

## 2018-04-17 ENCOUNTER — Other Ambulatory Visit: Payer: Self-pay

## 2018-04-17 ENCOUNTER — Ambulatory Visit (HOSPITAL_COMMUNITY)
Admission: RE | Admit: 2018-04-17 | Discharge: 2018-04-17 | Disposition: A | Discharge status: Home / Self Care | Payer: Medicaid Other | Source: Ambulatory Visit | Attending: Family Medicine | Admitting: Family Medicine

## 2018-04-17 ENCOUNTER — Ambulatory Visit (HOSPITAL_COMMUNITY)
Admission: AD | Admit: 2018-04-17 | Discharge: 2018-04-17 | DRG: 819 | Disposition: A | Discharge status: Home / Self Care | Payer: Medicaid Other | Attending: Obstetrics & Gynecology | Admitting: Obstetrics & Gynecology

## 2018-04-17 DIAGNOSIS — Z3A01 Less than 8 weeks gestation of pregnancy: Secondary | ICD-10-CM

## 2018-04-17 DIAGNOSIS — Z87891 Personal history of nicotine dependence: Secondary | ICD-10-CM

## 2018-04-17 DIAGNOSIS — O00102 Left tubal pregnancy without intrauterine pregnancy: Secondary | ICD-10-CM

## 2018-04-17 DIAGNOSIS — O009 Unspecified ectopic pregnancy without intrauterine pregnancy: Secondary | ICD-10-CM

## 2018-04-17 DIAGNOSIS — O3680X0 Pregnancy with inconclusive fetal viability, not applicable or unspecified: Secondary | ICD-10-CM

## 2018-04-17 DIAGNOSIS — N760 Acute vaginitis: Secondary | ICD-10-CM

## 2018-04-17 DIAGNOSIS — B9689 Other specified bacterial agents as the cause of diseases classified elsewhere: Secondary | ICD-10-CM

## 2018-04-17 HISTORY — DX: Essential (primary) hypertension: I10

## 2018-04-17 HISTORY — DX: Gestational (pregnancy-induced) hypertension without significant proteinuria, unspecified trimester: O13.9

## 2018-04-17 HISTORY — PX: LAPAROSCOPIC UNILATERAL SALPINGECTOMY: SHX5934

## 2018-04-17 HISTORY — DX: Left tubal pregnancy without intrauterine pregnancy: O00.102

## 2018-04-17 HISTORY — PX: DIAGNOSTIC LAPAROSCOPY WITH REMOVAL OF ECTOPIC PREGNANCY: SHX6449

## 2018-04-17 LAB — CBC
HCT: 37.2 % (ref 36.0–46.0)
Hemoglobin: 11.9 g/dL — ABNORMAL LOW (ref 12.0–15.0)
MCH: 28.7 pg (ref 26.0–34.0)
MCHC: 32 g/dL (ref 30.0–36.0)
MCV: 89.9 fL (ref 80.0–100.0)
Platelets: 314 10*3/uL (ref 150–400)
RBC: 4.14 MIL/uL (ref 3.87–5.11)
RDW: 13.8 % (ref 11.5–15.5)
WBC: 9.4 10*3/uL (ref 4.0–10.5)
nRBC: 0 % (ref 0.0–0.2)

## 2018-04-17 LAB — COMPREHENSIVE METABOLIC PANEL
ALT: 17 U/L (ref 0–44)
AST: 17 U/L (ref 15–41)
Albumin: 3.6 g/dL (ref 3.5–5.0)
Alkaline Phosphatase: 48 U/L (ref 38–126)
Anion gap: 9 (ref 5–15)
BILIRUBIN TOTAL: 0.5 mg/dL (ref 0.3–1.2)
BUN: 11 mg/dL (ref 6–20)
CO2: 23 mmol/L (ref 22–32)
Calcium: 9 mg/dL (ref 8.9–10.3)
Chloride: 101 mmol/L (ref 98–111)
Creatinine, Ser: 0.73 mg/dL (ref 0.44–1.00)
GFR calc Af Amer: >60 mL/min (ref >60)
GFR calc non Af Amer: >60 mL/min (ref >60)
Glucose, Bld: 92 mg/dL (ref 70–99)
Potassium: 3.9 mmol/L (ref 3.5–5.1)
Sodium: 133 mmol/L — ABNORMAL LOW (ref 135–145)
TOTAL PROTEIN: 7.1 g/dL (ref 6.5–8.1)

## 2018-04-17 LAB — TYPE AND SCREEN
ABO/RH(D): O POS
Antibody Screen: NEGATIVE

## 2018-04-17 LAB — HCG, QUANTITATIVE, PREGNANCY: hCG, Beta Chain, Quant, S: 7610 m[IU]/mL — ABNORMAL HIGH (ref <5)

## 2018-04-17 SURGERY — LAPAROSCOPY, WITH ECTOPIC PREGNANCY SURGICAL TREATMENT
Anesthesia: General

## 2018-04-17 MED ORDER — ROCURONIUM BROMIDE 100 MG/10ML IV SOLN
INTRAVENOUS | Status: AC
  Filled 2018-04-17: qty 1

## 2018-04-17 MED ORDER — SUGAMMADEX SODIUM 200 MG/2ML IV SOLN
INTRAVENOUS | Status: DC | PRN
  Administered 2018-04-17 (×2): 200 mg via INTRAVENOUS
  Administered 2018-04-17: 100 mg via INTRAVENOUS

## 2018-04-17 MED ORDER — ROCURONIUM BROMIDE 100 MG/10ML IV SOLN
INTRAVENOUS | Status: DC | PRN
  Administered 2018-04-17: 10 mg via INTRAVENOUS
  Administered 2018-04-17: 40 mg via INTRAVENOUS

## 2018-04-17 MED ORDER — DOCUSATE SODIUM 100 MG PO CAPS: 100.0000 mg | ORAL_CAPSULE | Freq: Two times a day (BID) | ORAL | 2 refills | Status: DC | PRN

## 2018-04-17 MED ORDER — DEXAMETHASONE SODIUM PHOSPHATE 10 MG/ML IJ SOLN
INTRAMUSCULAR | Status: DC | PRN
  Administered 2018-04-17: 4 mg via INTRAVENOUS

## 2018-04-17 MED ORDER — BUPIVACAINE HCL (PF) 0.5 % IJ SOLN
INTRAMUSCULAR | Status: DC | PRN
  Administered 2018-04-17: 30 mL

## 2018-04-17 MED ORDER — MEPERIDINE HCL 25 MG/ML IJ SOLN
INTRAMUSCULAR | Status: AC
  Filled 2018-04-17: qty 1

## 2018-04-17 MED ORDER — FENTANYL CITRATE (PF) 100 MCG/2ML IJ SOLN: 25.0000 ug | INTRAMUSCULAR | Status: DC | PRN

## 2018-04-17 MED ORDER — MIDAZOLAM HCL 2 MG/2ML IJ SOLN
INTRAMUSCULAR | Status: AC
  Filled 2018-04-17: qty 2

## 2018-04-17 MED ORDER — LIDOCAINE HCL (CARDIAC) PF 100 MG/5ML IV SOSY
PREFILLED_SYRINGE | INTRAVENOUS | Status: AC
  Filled 2018-04-17: qty 5

## 2018-04-17 MED ORDER — OXYCODONE-ACETAMINOPHEN 5-325 MG PO TABS: 1.0000 | ORAL_TABLET | ORAL | 0 refills | Status: DC | PRN

## 2018-04-17 MED ORDER — KETOROLAC TROMETHAMINE 30 MG/ML IJ SOLN
INTRAMUSCULAR | Status: DC | PRN
  Administered 2018-04-17: 30 mg via INTRAVENOUS

## 2018-04-17 MED ORDER — LIDOCAINE HCL (CARDIAC) PF 100 MG/5ML IV SOSY
PREFILLED_SYRINGE | INTRAVENOUS | Status: DC | PRN
  Administered 2018-04-17: 80 mg via INTRAVENOUS

## 2018-04-17 MED ORDER — SODIUM CHLORIDE 0.9 % IR SOLN
Status: DC | PRN
  Administered 2018-04-17: 3000 mL

## 2018-04-17 MED ORDER — KETOROLAC TROMETHAMINE 30 MG/ML IJ SOLN
INTRAMUSCULAR | Status: AC
  Filled 2018-04-17: qty 1

## 2018-04-17 MED ORDER — DEXAMETHASONE SODIUM PHOSPHATE 10 MG/ML IJ SOLN
INTRAMUSCULAR | Status: AC
  Filled 2018-04-17: qty 1

## 2018-04-17 MED ORDER — OXYCODONE HCL 5 MG PO TABS: 5.0000 mg | ORAL_TABLET | Freq: Once | ORAL | Status: DC | PRN

## 2018-04-17 MED ORDER — SOD CITRATE-CITRIC ACID 500-334 MG/5ML PO SOLN
30.0000 mL | Freq: Once | ORAL | Status: AC
  Administered 2018-04-17: 30 mL via ORAL
  Filled 2018-04-17: qty 15

## 2018-04-17 MED ORDER — SUGAMMADEX SODIUM 500 MG/5ML IV SOLN
INTRAVENOUS | Status: AC
  Filled 2018-04-17: qty 5

## 2018-04-17 MED ORDER — ACETAMINOPHEN 160 MG/5ML PO SOLN: 325.0000 mg | ORAL | Status: DC | PRN

## 2018-04-17 MED ORDER — LACTATED RINGERS IV SOLN
INTRAVENOUS | Status: DC
  Administered 2018-04-17 (×2): via INTRAVENOUS

## 2018-04-17 MED ORDER — OXYCODONE HCL 5 MG/5ML PO SOLN: 5.0000 mg | Freq: Once | ORAL | Status: DC | PRN

## 2018-04-17 MED ORDER — IBUPROFEN 600 MG PO TABS: 600.0000 mg | ORAL_TABLET | Freq: Four times a day (QID) | ORAL | 2 refills | Status: DC | PRN

## 2018-04-17 MED ORDER — METRONIDAZOLE 500 MG PO TABS: 500.0000 mg | ORAL_TABLET | Freq: Two times a day (BID) | ORAL | 0 refills | Status: DC

## 2018-04-17 MED ORDER — MIDAZOLAM HCL 2 MG/2ML IJ SOLN
INTRAMUSCULAR | Status: DC | PRN
  Administered 2018-04-17: 2 mg via INTRAVENOUS

## 2018-04-17 MED ORDER — FAMOTIDINE IN NACL 20-0.9 MG/50ML-% IV SOLN
20.0000 mg | Freq: Once | INTRAVENOUS | Status: AC
  Administered 2018-04-17: 20 mg via INTRAVENOUS
  Filled 2018-04-17: qty 50

## 2018-04-17 MED ORDER — ONDANSETRON HCL 4 MG/2ML IJ SOLN
INTRAMUSCULAR | Status: AC
  Filled 2018-04-17: qty 2

## 2018-04-17 MED ORDER — ONDANSETRON HCL 4 MG/2ML IJ SOLN: 4.0000 mg | Freq: Once | INTRAMUSCULAR | Status: DC | PRN

## 2018-04-17 MED ORDER — MEPERIDINE HCL 25 MG/ML IJ SOLN
6.2500 mg | INTRAMUSCULAR | Status: DC | PRN
  Administered 2018-04-17: 12.5 mg via INTRAVENOUS

## 2018-04-17 MED ORDER — ACETAMINOPHEN 325 MG PO TABS: 325.0000 mg | ORAL_TABLET | ORAL | Status: DC | PRN

## 2018-04-17 MED ORDER — ONDANSETRON HCL 4 MG/2ML IJ SOLN
INTRAMUSCULAR | Status: DC | PRN
  Administered 2018-04-17: 4 mg via INTRAVENOUS

## 2018-04-17 MED ORDER — FENTANYL CITRATE (PF) 100 MCG/2ML IJ SOLN
INTRAMUSCULAR | Status: DC | PRN
  Administered 2018-04-17: 100 ug via INTRAVENOUS
  Administered 2018-04-17 (×3): 50 ug via INTRAVENOUS

## 2018-04-17 MED ORDER — PROPOFOL 10 MG/ML IV BOLUS
INTRAVENOUS | Status: AC
  Filled 2018-04-17: qty 20

## 2018-04-17 MED ORDER — PROPOFOL 10 MG/ML IV BOLUS
INTRAVENOUS | Status: DC | PRN
  Administered 2018-04-17: 180 mg via INTRAVENOUS

## 2018-04-17 MED ORDER — FENTANYL CITRATE (PF) 250 MCG/5ML IJ SOLN
INTRAMUSCULAR | Status: AC
  Filled 2018-04-17: qty 5

## 2018-04-17 SURGICAL SUPPLY — 27 items
CABLE HIGH FREQUENCY MONO STRZ (ELECTRODE) IMPLANT
DERMABOND ADVANCED (GAUZE/BANDAGES/DRESSINGS) ×2
DERMABOND ADVANCED .7 DNX12 (GAUZE/BANDAGES/DRESSINGS) ×3 IMPLANT
DRSG OPSITE POSTOP 3X4 (GAUZE/BANDAGES/DRESSINGS) ×5 IMPLANT
DURAPREP 26ML APPLICATOR (WOUND CARE) ×5 IMPLANT
GLOVE BIOGEL PI IND STRL 7.0 (GLOVE) ×12 IMPLANT
GLOVE BIOGEL PI INDICATOR 7.0 (GLOVE) ×8
GLOVE ECLIPSE 7.0 STRL STRAW (GLOVE) ×5 IMPLANT
GOWN STRL REUS W/TWL LRG LVL3 (GOWN DISPOSABLE) ×15 IMPLANT
NEEDLE INSUFFLATION 120MM (ENDOMECHANICALS) ×5 IMPLANT
NS IRRIG 1000ML POUR BTL (IV SOLUTION) ×5 IMPLANT
PACK LAPAROSCOPY BASIN (CUSTOM PROCEDURE TRAY) ×5 IMPLANT
PACK TRENDGUARD 450 HYBRID PRO (MISCELLANEOUS) ×3 IMPLANT
POUCH SPECIMEN RETRIEVAL 10MM (ENDOMECHANICALS) ×5 IMPLANT
PROTECTOR NERVE ULNAR (MISCELLANEOUS) ×10 IMPLANT
SET IRRIG TUBING LAPAROSCOPIC (IRRIGATION / IRRIGATOR) ×5 IMPLANT
SHEARS HARMONIC ACE PLUS 36CM (ENDOMECHANICALS) ×5 IMPLANT
SLEEVE XCEL OPT CAN 5 100 (ENDOMECHANICALS) ×5 IMPLANT
SUT VICRYL 0 UR6 27IN ABS (SUTURE) ×10 IMPLANT
SUT VICRYL 4-0 PS2 18IN ABS (SUTURE) ×5 IMPLANT
SYSTEM CARTER THOMASON II (TROCAR) IMPLANT
TOWEL OR 17X24 6PK STRL BLUE (TOWEL DISPOSABLE) ×10 IMPLANT
TRENDGUARD 450 HYBRID PRO PACK (MISCELLANEOUS) ×5
TROCAR XCEL NON-BLD 11X100MML (ENDOMECHANICALS) ×5 IMPLANT
TROCAR XCEL NON-BLD 5MMX100MML (ENDOMECHANICALS) ×5 IMPLANT
TUBING INSUF HEATED (TUBING) ×5 IMPLANT
WARMER LAPAROSCOPE (MISCELLANEOUS) ×5 IMPLANT

## 2018-04-17 NOTE — MAU Provider Note (Signed)
Please refer to preoperative H & P for this encounter's details.  Jennifer CollinsUGONNA  Jennifer Schelling, MD, FACOG Obstetrician & Gynecologist Faculty Practice, [facility] [place]

## 2018-04-17 NOTE — Anesthesia Procedure Notes (Addendum)
Procedure Name: Intubation Date/Time: 04/17/2018 12:44 PM Performed by: Asher Muir, CRNA Pre-anesthesia Checklist: Patient identified, Emergency Drugs available, Suction available and Patient being monitored Patient Re-evaluated:Patient Re-evaluated prior to induction Oxygen Delivery Method: Circle system utilized and Simple face mask Preoxygenation: Pre-oxygenation with 100% oxygen Induction Type: IV induction and Cricoid Pressure applied Ventilation: Mask ventilation without difficulty Laryngoscope Size: Mac and 4 Grade View: Grade II Tube type: Oral Tube size: 7.0 mm Number of attempts: 1 Airway Equipment and Method: Stylet Placement Confirmation: ETT inserted through vocal cords under direct vision,  positive ETCO2 and breath sounds checked- equal and bilateral Secured at: 20 (right lip) cm Tube secured with: Tape Dental Injury: Teeth and Oropharynx as per pre-operative assessment

## 2018-04-17 NOTE — Anesthesia Postprocedure Evaluation (Signed)
Anesthesia Post Note  Patient: Jennifer Harding  Procedure(s) Performed: DIAGNOSTIC LAPAROSCOPY WITH REMOVAL OF ECTOPIC PREGNANCY (Left ) LAPAROSCOPIC LEFT SALPINGECTOMY     Patient location during evaluation: PACU Anesthesia Type: General Level of consciousness: awake and alert Pain management: pain level controlled Vital Signs Assessment: post-procedure vital signs reviewed and stable Respiratory status: spontaneous breathing, nonlabored ventilation, respiratory function stable and patient connected to nasal cannula oxygen Cardiovascular status: blood pressure returned to baseline and stable Postop Assessment: no apparent nausea or vomiting Anesthetic complications: no    Last Vitals:  Vitals:   04/17/18 1441 04/17/18 1524  BP:  139/75  Pulse: 100 100  Resp: (!) 23 20  Temp: 36.9 C   SpO2: 100% 100%    Last Pain:  Vitals:   04/17/18 1441  TempSrc: Oral  PainSc:    Pain Goal:                 Jasun Gasparini

## 2018-04-17 NOTE — Progress Notes (Signed)
Pt to OR vis stretcher

## 2018-04-17 NOTE — OR Nursing (Signed)
CALLED FOR PATIENT.

## 2018-04-17 NOTE — H&P (Signed)
Preoperative History and Physical  Jennifer MaxwellBrienne Harding is a 22 y.o. G3P1010 here for surgical management of recently diagnosed viable left adnexal ectopic pregnancy. Was here today for follow up ultrasound after being seen in 04/06/2018 in [facility] for pregnancy of unknown anatomic location. Follow up HCG on 04/11/18 did not show appropriate rise of HCG and patient was asymptomatic, scheduled for follow up scan today.  Today, she reported minimal pain, but ultrasound showed viable left adnexal ectopic.  Patient was told of results, and also informed of need for surgical management. She was sent to [facility] for preoperative preparation.   No significant preoperative concerns.  Proposed surgery:  Laparoscopic removal of fallopian tube containing ectopic pregnancy.  Past Medical History:  Diagnosis Date  . Hematuria   . Hypertension   . Pregnancy induced hypertension    Past Surgical History:  Procedure Laterality Date  . CESAREAN SECTION    . TONSILLECTOMY     OB History  Gravida Para Term Preterm AB Living  3 1 1   1     SAB TAB Ectopic Multiple Live Births  1            # Outcome Date GA Lbr Len/2nd Weight Sex Delivery Anes PTL Lv  3 Current           2 Term 03/22/16 [id number]   F CS-LTranv        Complications: Breech presentation  1 SAB           Patient denies any other pertinent gynecologic issues.   No current facility-administered medications on file prior to encounter.    Current Outpatient Medications on File Prior to Encounter  Medication Sig Dispense Refill  . Prenatal Vit-Fe Fumarate-FA (PRENATAL MULTIVITAMIN) TABS tablet Take 1 tablet by mouth daily at 12 noon.     Allergies  Allergen Reactions  . Latex Rash    Social History:   reports that she has quit smoking. Her smoking use included cigarettes. She has a 4.00 pack-year smoking history. She has quit using smokeless tobacco. She reports that she does not drink alcohol or use drugs.  History reviewed. No pertinent family  history.  Review of Systems: Pertinent items noted in HPI and remainder of comprehensive ROS otherwise negative.  PHYSICAL EXAM: Blood pressure 139/77, pulse 96, temperature 98.2 F (36.8 C), temperature source Oral, resp. rate 20, height 5\' 6"  (1.676 m), last menstrual period 12/22/2017, SpO2 100 %, unknown if currently breastfeeding. CONSTITUTIONAL: Well-developed, well-nourished female in no acute distress.  HENT:  Normocephalic, atraumatic, External right and left ear normal. Oropharynx is clear and moist EYES: Conjunctivae and EOM are normal. Pupils are equal, round, and reactive to light. No scleral icterus.  NECK: Normal range of motion, supple, no masses SKIN: Skin is warm and dry. No rash noted. Not diaphoretic. No erythema. No pallor. NEUROLOGIC: Alert and oriented to person, place, and time. Normal reflexes, muscle tone coordination. No cranial nerve deficit noted. PSYCHIATRIC: Normal mood and affect. Normal behavior. Normal judgment and thought content. CARDIOVASCULAR: Normal heart rate noted, regular rhythm RESPIRATORY: Effort and breath sounds normal, no problems with respiration noted ABDOMEN: Soft, minimal lower abdominal tenderness, no rebound, no guarding, nondistended. PELVIC: Deferred MUSCULOSKELETAL: Normal range of motion. No edema and no tenderness. 2+ distal pulses.  Labs: Results for orders placed or performed in visit on 04/11/18 (from the past 336 hour(s))  hCG, quantitative, pregnancy   Collection Time: 04/11/18  9:26 AM  Result Value Ref Range   hCG, Beta Chain,  Quant, S 1,148 (H) <5 mIU/mL  Results for orders placed or performed during the hospital encounter of 04/08/18 (from the past 336 hour(s))  hCG, quantitative, pregnancy   Collection Time: 04/08/18 12:42 PM  Result Value Ref Range   hCG, Beta Chain, Quant, S 647 (H) <5 mIU/mL  Results for orders placed or performed during the hospital encounter of 04/06/18 (from the past 336 hour(s))  GC/Chlamydia  probe amp ([place])not at [facility]   Collection Time: 04/06/18 12:00 AM  Result Value Ref Range   Chlamydia Negative    Neisseria gonorrhea Negative   Urinalysis, Routine w reflex microscopic   Collection Time: 04/06/18 10:37 AM  Result Value Ref Range   Color, Urine YELLOW YELLOW   APPearance HAZY (A) CLEAR   Specific Gravity, Urine 1.012 1.005 - 1.030   pH 7.0 5.0 - 8.0   Glucose, UA NEGATIVE NEGATIVE mg/dL   Hgb urine dipstick LARGE (A) NEGATIVE   Bilirubin Urine NEGATIVE NEGATIVE   Ketones, ur NEGATIVE NEGATIVE mg/dL   Protein, ur NEGATIVE NEGATIVE mg/dL   Nitrite NEGATIVE NEGATIVE   Leukocytes, UA NEGATIVE NEGATIVE   RBC / HPF 0-5 0 - 5 RBC/hpf   WBC, UA 0-5 0 - 5 WBC/hpf   Bacteria, UA RARE (A) NONE SEEN   Squamous Epithelial / LPF 0-5 0 - 5   Mucus PRESENT   Pregnancy, urine POC   Collection Time: 04/06/18 10:40 AM  Result Value Ref Range   Preg Test, Ur POSITIVE (A) NEGATIVE  Wet prep, genital   Collection Time: 04/06/18 10:55 AM  Result Value Ref Range   Yeast Wet Prep HPF POC NONE SEEN NONE SEEN   Trich, Wet Prep NONE SEEN NONE SEEN   Clue Cells Wet Prep HPF POC PRESENT (A) NONE SEEN   WBC, Wet Prep HPF POC MODERATE (A) NONE SEEN   Sperm NONE SEEN   hCG, quantitative, pregnancy   Collection Time: 04/06/18 11:29 AM  Result Value Ref Range   hCG, Beta Chain, Quant, S 427 (H) <5 mIU/mL    Imaging Studies: US Ob Comp Less 14 Wks  Result Date: 04/06/2018 CLINICAL DATA:  Cramping, spotting EXAM: OBSTETRIC <14 WK [place] AND TRANSVAGINAL OB US TECHNIQUE: Both transabdominal and transvaginal ultrasound examinations were performed for complete evaluation of the gestation as well as the maternal uterus, adnexal regions, and pelvic cul-de-sac. Transvaginal technique was performed to assess early pregnancy. COMPARISON:  None. FINDINGS: Intrauterine gestational sac: None Yolk sac:  Not visualized Embryo:  Not visualized Cardiac Activity: Not visualized Heart Rate:   bpm MSD:    mm    w     d CRL:    mm    w    d                  [place] EDC: Subchorionic hemorrhage:  None visualized. Maternal uterus/adnexae: No adnexal mass. Small amount of free fluid. 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 IMPRESSION: No intrauterine gestation visualized. Bicornuate uterus. Small amount of free fluid in the pelvis. Electronically Signed   By: Charlett Nose M.D.   On: 04/06/2018 12:09   US Ob Transvaginal  Result Date: 04/17/2018 CLINICAL DATA:  Pregnancy of unknown anatomic location. EXAM: TRANSVAGINAL OB ULTRASOUND TECHNIQUE: Transvaginal ultrasound was performed for complete evaluation of the gestation as well as the maternal uterus, adnexal regions, and pelvic cul-de-sac. COMPARISON:  04/06/2018 FINDINGS: Intrauterine gestational sac: None Yolk sac:  Visualized in left adnexa Embryo:  Visualized and left adnexa  Cardiac Activity: Visualized Heart Rate: 136 bpm MSD:   mm    w     d CRL:   3.2 mm   5 w 6 d                  [place] EDC: 12/12/2018 Subchorionic hemorrhage:  None visualized. Maternal uterus/adnexae: Bicornuate uterus noted. No intrauterine gestational sac. There is a live ectopic pregnancy immediately adjacent to the left ovary which measures 2.1 x 1.7 x 1.6 cm. Small amount of free fluid in the pelvis. IMPRESSION: Live ectopic pregnancy in the left adnexa immediately adjacent to the left ovary measuring up to 2.1 cm with a fetal heart rate 136 beats per minute. Small amount of free fluid in the pelvis. Critical Value/emergent results were called by telephone at the time of interpretation on 04/17/2018 at 10:03 am to Dr. Gust Rung, who verbally acknowledged these results. Electronically Signed   By: Charlett Nose M.D.   On: 04/17/2018 10:06   US Ob Transvaginal  Result Date: 04/06/2018 CLINICAL DATA:  Cramping, spotting EXAM: OBSTETRIC <14 WK [place] AND TRANSVAGINAL OB US TECHNIQUE: Both transabdominal and transvaginal ultrasound examinations were performed for complete evaluation of the gestation as  well as the maternal uterus, adnexal regions, and pelvic cul-de-sac. Transvaginal technique was performed to assess early pregnancy. COMPARISON:  None. FINDINGS: Intrauterine gestational sac: None Yolk sac:  Not visualized Embryo:  Not visualized Cardiac Activity: Not visualized Heart Rate:   bpm MSD:   mm    w     d CRL:    mm    w    d                  [place] EDC: Subchorionic hemorrhage:  None visualized. Maternal uterus/adnexae: No adnexal mass. Small amount of free fluid. 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 IMPRESSION: No intrauterine gestation visualized. Bicornuate uterus. Small amount of free fluid in the pelvis. Electronically Signed   By: Charlett Nose M.D.   On: 04/06/2018 12:09    Assessment: Patient Active Problem List   Diagnosis Date Noted  . Left tubal pregnancy without intrauterine pregnancy 04/17/2018    Plan: Patient will undergo surgical management with laparoscopic removal of fallopian tube containing ectopic pregnancy.  The risks of surgery were discussed in detail with the patient including but not limited to: bleeding which may require transfusion or reoperation; infection which may require antibiotics; injury to surrounding organs which may involve bowel, bladder, ureters ; need for additional procedures including laparotomy; thromboembolic phenomenon, surgical site problems and other postoperative/anesthesia complications. Likelihood of success in alleviating the patient's condition was discussed. Routine postoperative instructions will be reviewed with the patient and her family in detail after surgery.  The patient concurred with the proposed plan, giving informed written consent for the surgery.  Patient has been NPO since last night and she will remain NPO for procedure.  Anesthesia and OR aware.  Preoperative prophylactic antibiotics and SCDs ordered on call to the OR.  To OR when ready.  Of note, on review of labs, patient was noted to have bacterial vaginitis. She will be  treated with metronidazole regimen, will be prescribed for her on discharge after surgery today.     Jaynie Collins, MD, FACOG Obstetrician & Gynecologist, [facility] for [employer], [facility]

## 2018-04-17 NOTE — Transfer of Care (Signed)
Immediate Anesthesia Transfer of Care Note  Patient: Jennifer Harding  Procedure(s) Performed: DIAGNOSTIC LAPAROSCOPY WITH REMOVAL OF ECTOPIC PREGNANCY (Left ) LAPAROSCOPIC LEFT SALPINGECTOMY  Patient Location: PACU  Anesthesia Type:General  Level of Consciousness: awake  Airway & Oxygen Therapy: Patient Spontanous Breathing and Patient connected to nasal cannula oxygen  Post-op Assessment: Report given to RN  Post vital signs: Reviewed and stable  Last Vitals:  Vitals Value Taken Time  BP    Temp    Pulse 105 04/17/2018  1:43 PM  Resp    SpO2 100 % 04/17/2018  1:43 PM  Vitals shown include unvalidated device data.  Last Pain:  Vitals:   04/17/18 1025  TempSrc: Oral         Complications: No apparent anesthesia complications

## 2018-04-17 NOTE — Discharge Instructions (Signed)
DISCHARGE INSTRUCTIONS: Laparoscopy  The following instructions have been prepared to help you care for yourself upon your return home today.  Wound care:  Do not get the incision wet for the first 24 hours. The incision should be kept clean and dry.  The Band-Aids or dressings may be removed the day after surgery.  Should the incision become sore, red, and swollen after the first week, check with your doctor.  Personal hygiene:  Shower the day after your procedure.  Activity and limitations:  Do NOT drive or operate any equipment today.  Do NOT lift anything more than 15 pounds for 2-3 weeks after surgery.  Do NOT rest in bed all day.  Walking is encouraged. Walk each day, starting slowly with 5-minute walks 3 or 4 times a day. Slowly increase the length of your walks.  Walk up and down stairs slowly.  Do NOT do strenuous activities, such as golfing, playing tennis, bowling, running, biking, weight lifting, gardening, mowing, or vacuuming for 2-4 weeks. Ask your doctor when it is okay to start.  Diet: Eat a light meal as desired this evening. You may resume your usual diet tomorrow.  Return to work: This is dependent on the type of work you do. For the most part you can return to a desk job within a week of surgery. If you are more active at work, please discuss this with your doctor.  What to expect after your surgery: You may have a slight burning sensation when you urinate on the first day. You may have a very small amount of blood in the urine. Expect to have a small amount of vaginal discharge/light bleeding for 1-2 weeks. It is not unusual to have abdominal soreness and bruising for up to 2 weeks. You may be tired and need more rest for about 1 week. You may experience shoulder pain for 24-72 hours. Lying flat in bed may relieve it.  Call your doctor for any of the following:  Develop a fever of 100.4 or greater  Inability to urinate 6 hours after discharge from  hospital  Severe pain not relieved by pain medications  Persistent of heavy bleeding at incision site  Redness or swelling around incision site after a week  Increasing nausea or vomiting  Patient Signature________________________________________ Nurse Signature_________________________________________Laparoscopic Surgery - Care After Laparoscopy is a surgical procedure. It is used to diagnose and treat diseases inside the belly(abdomen). It is usually a brief, common, and relatively simple procedure. The laparoscopeis a thin, lighted, pencil-sized instrument. It is like a telescope. It is inserted into your abdomen through a small cut (incision). Your caregiver can look at the organs inside your body through this instrument.  She can see if there is anything abnormal. Laparoscopy can be done either in a hospital or outpatient clinic. You may be given a mild sedative to help you relax before the procedure. Once in the operating room, you will be given a drug to make you sleep (general anesthesia). Laparoscopy usually lasts about 1 hour. After the procedure, you will be monitored in a recovery area until you are stable and doing well. Once you are home, it may take 3 to 7 days to fully recover.   Laparoscopy has relatively few risks. Your caregiver will discuss the risks with you before the procedure. Some problems that can occur include: RISKS AND COMPLICATIONS   Allergies to medicines.  Difficulty breathing.  Bleeding.  Infection.  Damage to other surrounding structures HOME CARE INSTRUCTIONS   Infection.  Bleeding.  Damage to other organs.  Anesthetic side effects.   Need for additional procedures such as open procedures/laparotomy PROCEDURE Once you receive anesthesia, your surgeon inflates the abdomen with a harmless gas (carbon dioxide). This makes the organs easier to see. The laparoscope is inserted into the abdomen through a small incision. This allows your surgeon to  see into the abdomen. Other small instruments are also inserted into the abdomen through other small openings. Many surgeons attach a video camera to the laparoscope to enlarge the view. During a laparoscopy, the surgeon may be looking for inflammation, infection, or cancer.  The surgeon may also need to take out certain organs or take tissue samples (biopsies). The specimens are sent to a specialist in looking at cells and tissue samples (pathologist). The pathologist examines them under a microscope to help to diagnose or confirm a disease. AFTER THE PROCEDURE   The incisions are closed with stitches (sutures) and Dermabond. Because these incisions are small (usually less than 1/2 inch), there is usually minimal discomfort after the procedure. There may also be discomfort from the instrument placement incisions in the abdomen. You will be given pain medicine to ease any discomfort.  You will rest in a recovery room for 1-2 hours until you are stable and doing well.  You may have some mild discomfort in the throat. This is from the tube placed in your throat while you were sleeping.  You may experience discomfort in the shoulder area from some trapped air between the liver and diaphragm. This sensation is normal and will slowly go away on its own.  The recovery time is shortened as long as there are no complications.  You will rest in a recovery room until stable and doing well. As long as there are no complications, you may be allowed to go home. Someone will need to drive you home and be with you for at least 24 hours once home. FINDING OUT THE RESULTS You will be called with the results of the pathology and will discuss these results with  your caregiver during your postoperative appointment. Do not assume everything is normal if you have not heard from your caregiver or the medical facility. It is important for you to follow up on all of your results. HOME CARE INSTRUCTIONS   Take all  medicines as directed.  Only take over-the-counter or prescription medicines for pain, discomfort, or fever as directed by your caregiver.  Resume daily activities as directed.  Showers are preferred over baths.  You may resume sexual activities in 1 week or as directed.  Do not drive while taking narcotics. SEEK MEDICAL CARE IF:  There is increasing abdominal pain.  You feel lightheaded or faint.  You have the chills.  You have an oral temperature above 102 F (38.9 C).  There is pus-like (purulent) drainage from any of the wounds.  You are unable to pass gas or have a bowel movement.  You feel sick to your stomach (nauseous) or throw up (vomit). MAKE SURE YOU:   Understand these instructions.  Will watch your condition.  Will get help right away if you are not doing well or get worse.  ExitCare Patient Information 2013 [employer] [place].

## 2018-04-17 NOTE — Anesthesia Preprocedure Evaluation (Signed)
Anesthesia Evaluation  Patient identified by MRN, date of birth, ID band Patient awake    Reviewed: Allergy & Precautions, H&P , NPO status , Patient's Chart, lab work & pertinent test results, reviewed documented beta blocker date and time   Airway Mallampati: II  TM Distance: >3 FB Neck ROM: full    Dental no notable dental hx.    Pulmonary neg pulmonary ROS, former smoker,    Pulmonary exam normal breath sounds clear to auscultation       Cardiovascular Exercise Tolerance: Good hypertension, negative cardio ROS   Rhythm:regular Rate:Normal     Neuro/Psych negative neurological ROS  negative psych ROS   GI/Hepatic negative GI ROS, Neg liver ROS,   Endo/Other  negative endocrine ROS  Renal/GU negative Renal ROS  negative genitourinary   Musculoskeletal   Abdominal   Peds  Hematology negative hematology ROS (+)   Anesthesia Other Findings   Reproductive/Obstetrics negative OB ROS                             Anesthesia Physical Anesthesia Plan  ASA: II and emergent  Anesthesia Plan: General   Post-op Pain Management:    Induction: Intravenous and Cricoid pressure planned  PONV Risk Score and Plan: 3 and Ondansetron, Dexamethasone, Treatment may vary due to age or medical condition and Midazolam  Airway Management Planned: Oral ETT  Additional Equipment:   Intra-op Plan:   Post-operative Plan: Extubation in OR  Informed Consent: I have reviewed the patients History and Physical, chart, labs and discussed the procedure including the risks, benefits and alternatives for the proposed anesthesia with the patient or authorized representative who has indicated his/her understanding and acceptance.   Dental Advisory Given  Plan Discussed with: CRNA, Anesthesiologist and Surgeon  Anesthesia Plan Comments: (  )        Anesthesia Quick Evaluation

## 2018-04-17 NOTE — Op Note (Signed)
04/17/2018  1:21 PM  PATIENT:  Jennifer Harding  22 y.o. female  PRE-OPERATIVE DIAGNOSIS:  left Ectopic Pregnancy  POST-OPERATIVE DIAGNOSIS: left Ectopic Pregnancy, omental adhesions  PROCEDURE:  Procedure(s): DIAGNOSTIC LAPAROSCOPY WITH REMOVAL OF ECTOPIC PREGNANCY (Left) LAPAROSCOPIC LEFT SALPINGECTOMY  SURGEON:  Surgeon(s) and Role:    * Laure Leone, Leanora IvanoffMyra C, MD - Primary  ASSISTANTS: Trenda MootsElizabeth Barefoot, MS3   ANESTHESIA:   local and general  EBL:  10 mL   BLOOD ADMINISTERED:none  DRAINS: none   LOCAL MEDICATIONS USED:  MARCAINE     SPECIMEN:  Source of Specimen:  left oviduct  DISPOSITION OF SPECIMEN:  PATHOLOGY  COUNTS:  YES  TOURNIQUET:  * No tourniquets in log *  DICTATION: .Dragon Dictation  PLAN OF CARE: Discharge to home after PACU  PATIENT DISPOSITION:  PACU - hemodynamically stable.   Delay start of Pharmacological VTE agent (>24hrs) due to surgical blood loss or risk of bleeding: not applicable  The risks, benefits, and alternatives of surgery were explained, understood, accepted.  In the operating room she was placed in the dorsal lithotomy position, and general anesthesia was given without complication. Her abdomen and vagina were prepped and draped in the usual sterile fashion. A timeout procedure was done. A bimanual exam revealed a small anteverted and mobile uterus. A Hulka manipulator was placed. Gloves were changed, and attention was turned to the abdomen. Approximately the 10 mL of 0.5% Marcaine was injected into the umbilicus. A vertical incision was made at the site. A varies needle was placed intraperitoneally. Low-flow CO2 was used to insufflate the abdomen to approximately 3-1/2 L. Once a good pneumoperitoneum was established, a 5 mm Excel trocar was placed. Laparoscopy confirmed correct placement. A 5 mm port was placed in the right lower quadrant and a 10 mm port in the left lower quadrant under direct laparoscopic visualization after injecting 0.5%  Marcaine in the incision sites. Her upper abdomen appeared normal. The uterus was heart shaped, consistent with her diagnosis of a bicornuate uterus. There were dense adhesion from the lower uterine segment to the anterior abdominal wall. There was a large omental adhesin to the anterior abdominal wall. My umbilical port was at least 3 cm away from this. A Harmonic scapel was used to hemostatically remove the left oviduct where an obvious ectopic in the ampullary region of the oviduct. I removed the oviduct through the 10 mm port with an endopouch.  The 10 mm port fascia was closed with a 0 vicryl suture using the [employer] fascial closure device. No defects were palpable. I removed the 5 mm ports and noted hemostasis after letting the CO2 escape from the abdomen. A subcuticular closure was done with 4-0 Vicryl suture at all incision sites. A Steri-Strip was placed across each incision. She was extubated and taken to the recovery room in stable condition.

## 2018-04-17 NOTE — MA[facility] Note (Signed)
Pt sent from U/S secondary live ectopic.  Pt to prepped for surgery.

## 2018-04-18 ENCOUNTER — Encounter (HOSPITAL_COMMUNITY): Payer: Self-pay | Admitting: Obstetrics & Gynecology

## 2018-05-08 ENCOUNTER — Ambulatory Visit: Payer: Medicaid Other | Admitting: Obstetrics & Gynecology

## 2018-05-21 ENCOUNTER — Telehealth: Payer: Self-pay | Admitting: Family Medicine

## 2018-05-21 NOTE — Telephone Encounter (Signed)
The patient called in with questions of a follow up appointment after her ectopic pregnancy.

## 2018-05-23 NOTE — Telephone Encounter (Signed)
Opened in error

## 2018-05-31 ENCOUNTER — Ambulatory Visit: Payer: Medicaid Other | Admitting: Obstetrics and Gynecology

## 2018-05-31 ENCOUNTER — Ambulatory Visit (INDEPENDENT_AMBULATORY_CARE_PROVIDER_SITE_OTHER): Payer: Medicaid Other | Admitting: Obstetrics and Gynecology

## 2018-05-31 ENCOUNTER — Encounter: Payer: Self-pay | Admitting: Obstetrics and Gynecology

## 2018-05-31 VITALS — BP 129/77 | HR 90 | Wt 221.0 lb

## 2018-05-31 DIAGNOSIS — Z09 Encounter for follow-up examination after completed treatment for conditions other than malignant neoplasm: Secondary | ICD-10-CM

## 2018-05-31 DIAGNOSIS — Z9889 Other specified postprocedural states: Secondary | ICD-10-CM

## 2018-05-31 MED ORDER — NORETHINDRONE 0.35 MG PO TABS: ORAL_TABLET | ORAL | 3 refills | Status: AC

## 2018-05-31 NOTE — Patient Instructions (Addendum)
No sex for two weeks and then take a home pregnancy test. If negative, start the pill. Wait a week before considering the pill effective.   Call the health department for a well woman exam for the end of the February to get your pap smears done.

## 2018-05-31 NOTE — Progress Notes (Signed)
Obstetrics and Gynecology Visit Return Patient Evaluation  Appointment Date: 05/31/2018  Primary Care Provider: Patient, No Pcp Per  OBGYN Clinic: [facility]  Chief Complaint: regular post op visit  History of Present Illness:  Jennifer Harding is a 23 y.o. G2P1011 s/p 12/10 l/s LS for ectopic pregnancy; she was discharged home from the pacu and path +for ectopic.   Interval History: Since that time, she is doing well and w/o complaint. Sex yesterday. No period yet. Pt with irregular periods prior to pregnancy, sometimes going 10m w/o a period, although she did have regular periods leading up to this pregnancy so when they stopped it made her suspicious for pregnancy.   Review of Systems: as noted in the History of Present Illness.  Medications:  None  Allergies: is allergic to latex.  Physical Exam:  BP 129/77   Pulse 90   Wt 221 lb (100.2 kg)   LMP 12/22/2017   BMI 35.67 kg/m  Body mass index is 35.67 kg/m. General appearance: Well nourished, well developed female in no acute distress.  Abdomen: obese, soft, nttp, nd. Well healed l/s port sites x 3. Suture cut and removed from the R and LLQ sites.  Neuro/Psych:  Normal mood and affect.     Assessment: pt doing well  Plan: Pt unsure if she wants to try again. I told her if she gets pregnant in the future to let the OB clinic know asap to follow her closely for rpt ectopic. Pt doesn't have insurance. Encouraged her to contact [facility] for well woman annual exam. Pt amenable to doing the minipill and d/w her re: when to start and how to take  RTC: PRN  Cornelia Copa MD Attending Center for [employer] [facility])

## 2019-04-12 ENCOUNTER — Other Ambulatory Visit: Payer: Self-pay

## 2019-04-12 ENCOUNTER — Emergency Department (HOSPITAL_COMMUNITY)
Admission: EM | Admit: 2019-04-12 | Discharge: 2019-04-13 | Disposition: A | Discharge status: Home / Self Care | Payer: Medicaid Other | Attending: Emergency Medicine | Admitting: Emergency Medicine

## 2019-04-12 ENCOUNTER — Encounter (HOSPITAL_COMMUNITY): Payer: Self-pay | Admitting: Emergency Medicine

## 2019-04-12 DIAGNOSIS — J3489 Other specified disorders of nose and nasal sinuses: Secondary | ICD-10-CM | POA: Diagnosis not present

## 2019-04-12 DIAGNOSIS — Z79899 Other long term (current) drug therapy: Secondary | ICD-10-CM | POA: Insufficient documentation

## 2019-04-12 DIAGNOSIS — R05 Cough: Secondary | ICD-10-CM | POA: Insufficient documentation

## 2019-04-12 DIAGNOSIS — Z91040 Latex allergy status: Secondary | ICD-10-CM | POA: Diagnosis not present

## 2019-04-12 DIAGNOSIS — K219 Gastro-esophageal reflux disease without esophagitis: Secondary | ICD-10-CM | POA: Insufficient documentation

## 2019-04-12 DIAGNOSIS — I10 Essential (primary) hypertension: Secondary | ICD-10-CM | POA: Insufficient documentation

## 2019-04-12 DIAGNOSIS — R059 Cough, unspecified: Secondary | ICD-10-CM

## 2019-04-12 DIAGNOSIS — T7840XA Allergy, unspecified, initial encounter: Secondary | ICD-10-CM | POA: Diagnosis not present

## 2019-04-12 DIAGNOSIS — R42 Dizziness and giddiness: Secondary | ICD-10-CM | POA: Diagnosis present

## 2019-04-12 DIAGNOSIS — Z87891 Personal history of nicotine dependence: Secondary | ICD-10-CM | POA: Insufficient documentation

## 2019-04-12 LAB — I-STAT BETA HCG BLOOD, ED (MC, WL, AP ONLY): I-stat hCG, quantitative: <5 m[IU]/mL (ref <5)

## 2019-04-12 LAB — COMPREHENSIVE METABOLIC PANEL
ALT: 20 U/L (ref 0–44)
AST: 18 U/L (ref 15–41)
Albumin: 4.1 g/dL (ref 3.5–5.0)
Alkaline Phosphatase: 71 U/L (ref 38–126)
Anion gap: 13 (ref 5–15)
BUN: 8 mg/dL (ref 6–20)
CO2: 25 mmol/L (ref 22–32)
Calcium: 9.4 mg/dL (ref 8.9–10.3)
Chloride: 98 mmol/L (ref 98–111)
Creatinine, Ser: 0.85 mg/dL (ref 0.44–1.00)
GFR calc Af Amer: >60 mL/min (ref >60)
GFR calc non Af Amer: >60 mL/min (ref >60)
Glucose, Bld: 113 mg/dL — ABNORMAL HIGH (ref 70–99)
Potassium: 3.5 mmol/L (ref 3.5–5.1)
Sodium: 136 mmol/L (ref 135–145)
Total Bilirubin: 0.5 mg/dL (ref 0.3–1.2)
Total Protein: 8 g/dL (ref 6.5–8.1)

## 2019-04-12 LAB — CBC WITH DIFFERENTIAL/PLATELET
Abs Immature Granulocytes: 0.02 10*3/uL (ref 0.00–0.07)
Basophils Absolute: 0 10*3/uL (ref 0.0–0.1)
Basophils Relative: 0 %
Eosinophils Absolute: 0.1 10*3/uL (ref 0.0–0.5)
Eosinophils Relative: 1 %
HCT: 41.5 % (ref 36.0–46.0)
Hemoglobin: 13.2 g/dL (ref 12.0–15.0)
Immature Granulocytes: 0 %
Lymphocytes Relative: 29 %
Lymphs Abs: 3.7 10*3/uL (ref 0.7–4.0)
MCH: 28.3 pg (ref 26.0–34.0)
MCHC: 31.8 g/dL (ref 30.0–36.0)
MCV: 88.9 fL (ref 80.0–100.0)
Monocytes Absolute: 0.6 10*3/uL (ref 0.1–1.0)
Monocytes Relative: 5 %
Neutro Abs: 8.2 10*3/uL — ABNORMAL HIGH (ref 1.7–7.7)
Neutrophils Relative %: 65 %
Platelets: 396 10*3/uL (ref 150–400)
RBC: 4.67 MIL/uL (ref 3.87–5.11)
RDW: 13.4 % (ref 11.5–15.5)
WBC: 12.7 10*3/uL — ABNORMAL HIGH (ref 4.0–10.5)
nRBC: 0 % (ref 0.0–0.2)

## 2019-04-12 NOTE — ED Triage Notes (Signed)
Pt states she is been having cough, dizziness for the past 2 weeks, in the past 2 weeks she had loss of taste and smell with fever and chills, but not this week.

## 2019-04-13 ENCOUNTER — Emergency Department (HOSPITAL_COMMUNITY): Payer: Medicaid Other

## 2019-04-13 MED ORDER — BENZONATATE 100 MG PO CAPS: 100.0000 mg | ORAL_CAPSULE | Freq: Three times a day (TID) | ORAL | 0 refills | Status: DC

## 2019-04-13 MED ORDER — CETIRIZINE-PSEUDOEPHEDRINE ER 5-120 MG PO TB12: 1.0000 | ORAL_TABLET | Freq: Every day | ORAL | 0 refills | Status: DC

## 2019-04-13 MED ORDER — OMEPRAZOLE 20 MG PO CPDR: 20.0000 mg | DELAYED_RELEASE_CAPSULE | Freq: Every day | ORAL | 0 refills | Status: DC

## 2019-04-13 NOTE — Discharge Instructions (Addendum)
Follow-up with [facility].  Please take Zyrtec as prescribed and use benzonatate as needed.  I have also attached outpatient resources for therapy as I believe this could be useful.  Please consider talk therapy as an option as well as increasing meditation, mindfulness, rest and other relaxation techniques.

## 2019-04-13 NOTE — ED Provider Notes (Signed)
[facility] EMERGENCY DEPARTMENT Provider Note   CSN: [id number] Arrival date & time: 04/12/19  2202     History   Chief Complaint Chief Complaint  Patient presents with  . Dizziness  . Cough    HPI Jennifer Harding is a 23 y.o. female      The history is provided by the patient.  Dizziness Associated symptoms: no chest pain and no headaches   Cough Cough characteristics:  Non-productive, dry and paroxysmal Severity:  Mild Onset quality:  Gradual Duration:  3 weeks Timing:  Intermittent Progression:  Unchanged Smoker: no   Associated symptoms: rhinorrhea and sinus congestion   Associated symptoms: no chest pain, no chills, no fever, no headaches, no myalgias and no rash     Patient presents today for 3 days of cough that is dry, without chest pain, fever.  Patient endorses associated congestion, allergies, GERD denies asthma. Patient states she has been coughing frequently enough at work yesterday she felt lightheaded.  Denies any dizziness.  Patient states that she used to smoke but stopped 1.5 years ago.  Patient states she also by PCP.  Takes no medications for her reflux or allergies other than frequent Tums.  Denies chest pain, shortness of breath.  States that cough is bothersome but nonpainful.  Denies any history of cancer.  Past Medical History:  Diagnosis Date  . Hematuria   . Hypertension   . Left tubal pregnancy without intrauterine pregnancy 04/17/2018  . Pregnancy induced hypertension     There are no active problems to display for this patient.   Past Surgical History:  Procedure Laterality Date  . CESAREAN SECTION    . DIAGNOSTIC LAPAROSCOPY WITH REMOVAL OF ECTOPIC PREGNANCY Left 04/17/2018   Procedure: DIAGNOSTIC LAPAROSCOPY WITH REMOVAL OF ECTOPIC PREGNANCY;  Surgeon: Allie Bossier, MD;  Location: [facility];  Service: Gynecology;  Laterality: Left;  . LAPAROSCOPIC UNILATERAL SALPINGECTOMY  04/17/2018   Procedure: LAPAROSCOPIC  LEFT SALPINGECTOMY;  Surgeon: Allie Bossier, MD;  Location: [facility];  Service: Gynecology;;  . TONSILLECTOMY       OB History    Gravida  3   Para  1   Term  1   Preterm      AB  1   Living        SAB  1   TAB      Ectopic      Multiple      Live Births               Home Medications    Prior to Admission medications   Medication Sig Start Date End Date Taking? Authorizing Provider  benzonatate (TESSALON) 100 MG capsule Take 1 capsule (100 mg total) by mouth every 8 (eight) hours. 04/13/19   Gailen Shelter, PA  cetirizine-pseudoephedrine (ZYRTEC-D) 5-120 MG tablet Take 1 tablet by mouth daily. 04/13/19   Gailen Shelter, PA  docusate sodium (COLACE) 100 MG capsule Take 1 capsule (100 mg total) by mouth 2 (two) times daily as needed for mild constipation or moderate constipation. Patient not taking: Reported on 05/31/2018 04/17/18   Tereso Newcomer, MD  ibuprofen (ADVIL,MOTRIN) 600 MG tablet Take 1 tablet (600 mg total) by mouth every 6 (six) hours as needed for headache, mild pain, moderate pain or cramping. Patient not taking: Reported on 05/31/2018 04/17/18   Tereso Newcomer, MD  metroNIDAZOLE (FLAGYL) 500 MG tablet Take 1 tablet (500 mg total) by mouth 2 (two) times daily.  Patient not taking: Reported on 05/31/2018 04/17/18   Osborne Oman, MD  norethindrone (MICRONOR,CAMILA,ERRIN) 0.35 MG tablet 1 tab po daily at the same every day. 05/31/18   Aletha Halim, MD  omeprazole (PRILOSEC) 20 MG capsule Take 1 capsule (20 mg total) by mouth daily for 14 days. 04/13/19 04/27/19  Tedd Sias, PA  oxyCODONE-acetaminophen (PERCOCET/ROXICET) 5-325 MG tablet Take 1 tablet by mouth every 4 (four) hours as needed for severe pain. Patient not taking: Reported on 05/31/2018 04/17/18   Osborne Oman, MD  Prenatal Vit-Fe Fumarate-FA (PRENATAL MULTIVITAMIN) TABS tablet Take 1 tablet by mouth daily at 12 noon.    [provider]    Family History No family  history on file.  Social History Social History   Tobacco Use  . Smoking status: Former Smoker    Packs/day: 1.00    Years: 4.00    Pack years: 4.00    Types: Cigarettes  . Smokeless tobacco: Former Systems developer  . Tobacco comment: Quit 2018  Substance Use Topics  . Alcohol use: No  . Drug use: No     Allergies   Latex   Review of Systems Review of Systems  Constitutional: Negative for chills and fever.  HENT: Positive for rhinorrhea.   Respiratory: Positive for cough.   Cardiovascular: Negative for chest pain.  Gastrointestinal: Negative for abdominal pain.  Musculoskeletal: Negative for myalgias.  Skin: Negative for rash.  Neurological: Positive for dizziness. Negative for headaches.     Physical Exam Updated Vital Signs BP 140/80 (BP Location: Right Arm)   Pulse 91   Temp 98.2 F (36.8 C) (Oral)   Resp 16   Ht 5\' 5"  (1.651 m)   Wt 95.3 kg   SpO2 100%   BMI 34.95 kg/m   Physical Exam Vitals signs and nursing note reviewed.  Constitutional:      General: She is not in acute distress.    Comments: Well-appearing, pleasant, 23 year old female appears stated  HENT:     Head: Normocephalic and atraumatic.     Nose: Nose normal.  Eyes:     General: No scleral icterus. Neck:     Musculoskeletal: Normal range of motion.  Cardiovascular:     Rate and Rhythm: Normal rate and regular rhythm.     Pulses: Normal pulses.     Heart sounds: Normal heart sounds.  Pulmonary:     Effort: Pulmonary effort is normal. No respiratory distress.     Breath sounds: Normal breath sounds. No wheezing.     Comments: Dry cough several times during H&P Abdominal:     Palpations: Abdomen is soft.     Tenderness: There is no abdominal tenderness.  Musculoskeletal:     Right lower leg: No edema.     Left lower leg: No edema.  Skin:    General: Skin is warm and dry.     Capillary Refill: Capillary refill takes less than 2 seconds.  Neurological:     Mental Status: She is alert.  Mental status is at baseline.  Psychiatric:        Mood and Affect: Mood normal.        Behavior: Behavior normal.      ED Treatments / Results  Labs (all labs ordered are listed, but only abnormal results are displayed) Labs Reviewed  CBC WITH DIFFERENTIAL/PLATELET - Abnormal; Notable for the following components:      Result Value   WBC 12.7 (*)    Neutro Abs 8.2 (*)  All other components within normal limits  COMPREHENSIVE METABOLIC PANEL - Abnormal; Notable for the following components:   Glucose, Bld 113 (*)    All other components within normal limits  I-STAT BETA HCG BLOOD, ED ([facility], [facility], [facility] ONLY)    EKG None  Radiology Dg Chest Port 1 View  Result Date: 04/13/2019 CLINICAL DATA:  Cough EXAM: PORTABLE CHEST 1 VIEW COMPARISON:  None. FINDINGS: The heart size and mediastinal contours are within normal limits. Both lungs are clear. The visualized skeletal structures are unremarkable. IMPRESSION: No active disease. Electronically Signed   By: Signa Kell M.D.   On: 04/13/2019 08:54    Procedures Procedures (including critical care time)  Medications Ordered in ED Medications - No data to display   Initial Impression / Assessment and Plan / ED Course  I have reviewed the triage vital signs and the nursing notes.  Pertinent labs & imaging results that were available during my care of the patient were reviewed by me and considered in my medical decision making (see chart for details).        Presents for cough for 3 weeks associated congestion and reflux which is not well controlled.  Blood work is within normal limits and was ordered in triage.  Patient is nonpregnant.  Considering patient has history of untreated reflux and allergies and has no acute signs of of pneumonia we will treat with reflux and allergy control as this may be causing her cough.  Will have patient follow-up with [facility].  Screening x-ray obtained as patient has history  of smoking.  Doubt lung cancer no need for CT scan at this time.   Straight without infiltrate, pneumothorax, fracture or other concerning finding.   Discharged with benzonatate for symptom control as well as omeprazole and Zyrtec as patient has an controlled allergy and GERD.     This patient appears reasonably screened and I doubt any other medical condition requiring further workup, evaluation, or treatment in the ED at this time prior to discharge.   Patient's vitals are WNL apart from vital sign abnormalities discussed above, patient is in NAD, and able to ambulate in the ED at their baseline. Pain has been managed or a plan has been made for home management and has no complaints prior to discharge. Patient is comfortable with above plan and is stable for discharge at this time. All questions were answered prior to disposition. Results from the ER workup discussed with the patient face to face and all questions answered to the best of my ability. The patient is safe for discharge with strict return precautions. Patient appears safe for discharge with appropriate follow-up. Conveyed my impression with the patient and they voiced understanding and are agreeable to plan.   An After Visit Summary was printed and given to the patient.  Portions of this note were generated with Scientist, clinical (histocompatibility and immunogenetics). Dictation errors may occur despite best attempts at proofreading.    Final Clinical Impressions(s) / ED Diagnoses   Final diagnoses:  Cough  Gastroesophageal reflux disease, unspecified whether esophagitis present  Allergy, initial encounter    ED Discharge Orders         Ordered    omeprazole (PRILOSEC) 20 MG capsule  Daily     04/13/19 0907    cetirizine-pseudoephedrine (ZYRTEC-D) 5-120 MG tablet  Daily     04/13/19 0907    benzonatate (TESSALON) 100 MG capsule  Every 8 hours     04/13/19 0907  Solon AugustaFondaw, Yasir Kitner [place], [place] 04/13/19 [id number]    Linwood DibblesKnapp, Jon, MD 04/14/19 [phone]

## 2019-09-24 ENCOUNTER — Encounter: Payer: Self-pay | Admitting: Family Medicine

## 2019-09-24 ENCOUNTER — Ambulatory Visit (INDEPENDENT_AMBULATORY_CARE_PROVIDER_SITE_OTHER): Payer: Medicaid Other | Admitting: Family Medicine

## 2019-09-24 ENCOUNTER — Other Ambulatory Visit: Payer: Self-pay

## 2019-09-24 VITALS — BP 135/80 | HR 98 | Ht 66.54 in | Wt 231.6 lb

## 2019-09-24 DIAGNOSIS — R05 Cough: Secondary | ICD-10-CM

## 2019-09-24 DIAGNOSIS — R053 Chronic cough: Secondary | ICD-10-CM

## 2019-09-24 DIAGNOSIS — Z7689 Persons encountering health services in other specified circumstances: Secondary | ICD-10-CM

## 2019-09-24 DIAGNOSIS — Z6836 Body mass index (BMI) 36.0-36.9, adult: Secondary | ICD-10-CM | POA: Diagnosis not present

## 2019-09-24 NOTE — Assessment & Plan Note (Signed)
Reviewed past medical, surgical, family, and social history.  Medications updated in epic.  Health maintenance reviewed and will perform Pap smear on follow-up.

## 2019-09-24 NOTE — Patient Instructions (Addendum)
GOALS:   Ultimate goal to lose weight overtime.   1. Walk 2-3 times for at least 30 minutes a week with your dog and/or daughter.  2. Meal prep 1-2 meals per week and have snacks available in the house.   Siggis yogurt.    Exercise handout provided separately through ACSM Exercise is Medicine Series, link below for further information and resources. Share with your family and friends!   [email or web address]-[email or web address]/rx-[email or web address]-health-[email or web address]    Follow up in 2-3 weeks for pap smear and discuss cough further.

## 2019-09-24 NOTE — Assessment & Plan Note (Signed)
Briefly discussed during today's visit, suspect may be related to uncontrolled reflux.  Discussed avoiding known triggers and working towards dietary changes as already discussed above.  May use Pepcid daily as she makes lifestyle changes to mitigate symptoms.  Will discuss further on follow-up.

## 2019-09-24 NOTE — Assessment & Plan Note (Signed)
Set realistic expectations for long-term success.  Established few SMART goals with patient, see AVS for further details.  Follow-up in 2-3 weeks to discuss changes and continued support.  Consider screening A1c on follow-up visit.

## 2019-09-24 NOTE — Progress Notes (Signed)
    SUBJECTIVE:   CHIEF COMPLAINT / HPI: establish care  Tyson is a 24 yo female presenting to establish care and discuss the following:   Weight management: frustrated at her weight. 231lbs today, 210 in 04/2019. Stopped snacking on chips and cookies, eating more hummus with veggies. 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   Yesterday recall (day off):  Ate some perogies.  Yogurt yoplait brand  McDonald's large fry and quarter pounder.  5 bottles of water. Liquor.   Cough: several months, went to ED for this in 04/2019. Happens after meals and when she's at work a lot, seems to occur with reflux. Also has allergies.   Care team: Previous PCP [facility]  Social history: Lives with her fianc, daughter 63 years old.  Has a dog and cat, named Denali in [place].  She exercises intermittently, likes to go on walks.  Quit smoking in 2019.  Drinks liquor 1-2 times monthly.  Feels safe in her relationships, has no concern for STIs.  Enjoys going on walks with her family and dog.  Enjoys movies and shopping.  Past medical history:  Allergies  Anxiety/depression  Acid reflux with history of cough  History of ?gestational hypertension vs preeclampsia  Past surgical history:   Had a C-section in 2017 due to breech position.  Ectopic pregnancy in 2020 requiring laparoscopic surgery  Family history: History of depression/anxiety and drug abuse in mother and grandparents.  Reports early death in mother and grandparents (mom in her 31's due to overdose, grandmother in 8's due to alcohol abuse/seizures).     OBJECTIVE:   BP 135/80   Pulse 98   Ht 5' 6.54" (1.69 m)   Wt 231 lb 9.6 oz (105.1 kg)   SpO2 99%   BMI 36.78 kg/m   General: Alert, NAD HEENT: NCAT, MMM, Cardiac: RRR no m/g/r Lungs: Clear bilaterally, no increased WOB  Abdomen: soft, non-tender Msk: Moves all extremities spontaneously  Ext: Warm, dry, 2+ distal pulses, no edema    ASSESSMENT/PLAN:   Encounter to establish care with new doctor Reviewed past medical, surgical, family, and social history.  Medications updated in epic.  Health maintenance reviewed and will perform Pap smear on follow-up.  Body mass index (BMI) of 36.0-36.9 in adult Set realistic expectations for long-term success.  Established few SMART goals with patient, see AVS for further details.  Follow-up in 2-3 weeks to discuss changes and continued support.  Consider screening A1c on follow-up visit.  Chronic cough Briefly discussed during today's visit, suspect may be related to uncontrolled reflux.  Discussed avoiding known triggers and working towards dietary changes as already discussed above.  May use Pepcid daily as she makes lifestyle changes to mitigate symptoms.  Will discuss further on follow-up.    Follow-up in 2-3 weeks for Pap smear and weight management/cough.   Allayne Stack, DO [place] [facility]

## 2019-10-28 ENCOUNTER — Ambulatory Visit (INDEPENDENT_AMBULATORY_CARE_PROVIDER_SITE_OTHER): Payer: Medicaid Other | Admitting: Family Medicine

## 2019-10-28 ENCOUNTER — Other Ambulatory Visit: Payer: Self-pay

## 2019-10-28 ENCOUNTER — Encounter: Payer: Self-pay | Admitting: Family Medicine

## 2019-10-28 ENCOUNTER — Other Ambulatory Visit (HOSPITAL_COMMUNITY)
Admission: RE | Admit: 2019-10-28 | Discharge: 2019-10-28 | Disposition: A | Discharge status: Home / Self Care | Payer: Medicaid Other | Source: Ambulatory Visit | Attending: Family Medicine | Admitting: Family Medicine

## 2019-10-28 VITALS — BP 110/80 | HR 106 | Wt 227.0 lb

## 2019-10-28 DIAGNOSIS — R053 Chronic cough: Secondary | ICD-10-CM

## 2019-10-28 DIAGNOSIS — K219 Gastro-esophageal reflux disease without esophagitis: Secondary | ICD-10-CM | POA: Diagnosis not present

## 2019-10-28 DIAGNOSIS — R7309 Other abnormal glucose: Secondary | ICD-10-CM

## 2019-10-28 DIAGNOSIS — Z114 Encounter for screening for human immunodeficiency virus [HIV]: Secondary | ICD-10-CM | POA: Diagnosis not present

## 2019-10-28 DIAGNOSIS — Z0000 Encounter for general adult medical examination without abnormal findings: Secondary | ICD-10-CM

## 2019-10-28 DIAGNOSIS — F419 Anxiety disorder, unspecified: Secondary | ICD-10-CM | POA: Diagnosis not present

## 2019-10-28 DIAGNOSIS — Z113 Encounter for screening for infections with a predominantly sexual mode of transmission: Secondary | ICD-10-CM

## 2019-10-28 DIAGNOSIS — R05 Cough: Secondary | ICD-10-CM | POA: Diagnosis not present

## 2019-10-28 DIAGNOSIS — Z124 Encounter for screening for malignant neoplasm of cervix: Secondary | ICD-10-CM | POA: Diagnosis not present

## 2019-10-28 DIAGNOSIS — F411 Generalized anxiety disorder: Secondary | ICD-10-CM | POA: Diagnosis not present

## 2019-10-28 LAB — POCT GLYCOSYLATED HEMOGLOBIN (HGB A1C): Hemoglobin A1C: 5.3 % (ref 4.0–5.6)

## 2019-10-28 MED ORDER — SERTRALINE HCL 50 MG PO TABS: 50.0000 mg | ORAL_TABLET | Freq: Every day | ORAL | 0 refills | Status: AC

## 2019-10-28 MED ORDER — PANTOPRAZOLE SODIUM 40 MG PO TBEC: 40.0000 mg | DELAYED_RELEASE_TABLET | Freq: Every day | ORAL | 1 refills | Status: AC

## 2019-10-28 MED ORDER — HYDROXYZINE HCL 10 MG PO TABS: 10.0000 mg | ORAL_TABLET | Freq: Three times a day (TID) | ORAL | 0 refills | Status: DC | PRN

## 2019-10-28 NOTE — Progress Notes (Signed)
SUBJECTIVE:   CHIEF COMPLAINT / HPI: papsmear, follow up   Jennifer Harding is a 24 yo female presenting for her papsmear/STD screening and to discuss the following:   Anxiety: She has had a long-term history of this.  Previously on BuSpar several years ago which worked well, however she could not tolerate side effects.  Felt like she had bugs crawling on her whenever she used it.  She feels like her anxiety has flared up in the past several weeks, constantly restless and anxious.  She thinks is gotten worse recently due to the stress at her job, she just quit due to this and is gotten a little bit better but still persistent.  Previously has been managing her anxiety on her own with breathing and relaxing techniques.  Predominantly anxious, feels a little bit down because this is interfering with her life so much, but denies SI/HI.  Has her fianc for support.  Does drink alcohol on a regular basis (reports as a coping mechanism), however denies any tobacco or illicit drug use at this time.  Interested in therapy.  Cough: Present for approximately the past 6 months.  Notices it occurring more after eating certain meals and at night.  Nonproductive.  Also has a history of reflux, will have burning chest discomforts and sore throat.  Worse when she eats spicy food or several alcoholic beverages.  Denies any consistent shortness of breath, wheezing, rhinorrhea.  Non-smoker.  PERTINENT  PMH / PSH: Elevated BMI, chronic cough  OBJECTIVE:   BP 110/80   Pulse (!) 106   Wt 227 lb (103 kg)   LMP 10/12/2019   SpO2 98%   BMI 36.05 kg/m   General: Alert, NAD HEENT: NCAT, MMM, oropharynx nonerythematous, no cervical adenopathy present bilaterally Lungs: No increased WOB pr Abdomen: soft, non-tender, non-distended Msk: Moves all extremities spontaneously  Ext: Warm, dry, 2+ distal pulses Pelvic exam: VULVA: normal appearing vulva with no masses, tenderness or lesions, VAGINA: normal appearing vagina with  normal color and discharge, no lesions, CERVIX: normal appearing cervix without discharge or lesions, UTERUS: uterus is normal size, shape, consistency and nontender, ADNEXA: normal adnexa in size, nontender and no masses.  GAD 7 : Generalized Anxiety Score 10/28/2019  Nervous, Anxious, on Edge 3  Control/stop worrying 3  Worry too much - different things 3  Trouble relaxing 3  Restless 3  Easily annoyed or irritable 3  Afraid - awful might happen 3  Total GAD 7 Score 21  Anxiety Difficulty Extremely difficult     Office Visit from 10/28/2019 in [place]  PHQ-2 Total Score 0      ASSESSMENT/PLAN:   Preventative health care Performed Pap smear with STD testing (HIV, RPR, hep C, gc/ch, trichomonas).  Additionally obtained screening A1c and lipid panel due to elevated BMI.  Encouraged her to continue working towards well-balanced diet and increasing physical activity.  Chronic cough Likely 2/2 uncontrolled acid reflux. Discussed avoiding known triggers and working towards dietary changes.  Will trial Protonix 40 mg daily, Tums/Pepcid PRN.  Follow-up in 1 month or sooner if worsening, however suspect it will take several weeks to see any improvement.  GAD (generalized anxiety disorder) GAD-7 score 21, significantly impacting her quality of life.  No concurrent SI/HI.  Will obtain TSH to rule out hyperthyroidism, however less likely without concurrent concerns.  Provided with 24-hour crisis hotline and therapy resources to establish care in the community.  Rx'd Zoloft 50 mg and hydroxyzine PRN  acute anxiety in the interim as Zoloft takes effect.  F/U in 1 month or sooner if needed.    Follow-up in 1 month.   Allayne Stack, DO [place] [facility]

## 2019-10-28 NOTE — Patient Instructions (Addendum)
It was wonderful to see you today.  I will let you know the results of your Pap and STD screening in the next several days.  Additionally we collected a lipid panel and A1c just to screen for any high cholesterol or diabetes.   For your chronic cough, we are going to trial Protonix to help with your reflux.  Please make sure you take this daily.  If you notice that burning sensation, you can also try Tums or Pepcid as needed to help.  Please try to cut back on alcohol, spicy foods, caffeine as this can make your symptoms worse.   Therapy and Counseling Resources Most providers on this list will take Medicaid. Patients with commercial insurance or Medicare should contact their insurance company to get a list of in network providers.  [employer]  [address] [place], [place] [address]      [place] [address]  [place], [place] [address] [phone]  [place] [address], [place]  [place], [place] [address]       [phone]      Jinny Blossom [facility] [address], [place], [place]  [facility]:  [place]. [place]  [facility] Counsel[facility]:  [place] [address], [place]  [facility] (under & uninsured) [address], [place] [phone]    [email or web address]    [facility] [place]     Phone:  [phone]     [place]  [place] [address]      [place], [place] [address] [place]: [place], [place], [place]   [place] ([place] therapist) [address] [place] [address]   [place] [address]    [phone]    [facility]   [place],  [place], [place]    [place]  [place]  [facility]  [address] [place], [place]        [phone]  Open Access/Walk In Clinic under & uninsured  [place], To schedule an appointment call [phone] [address] [place] [phone]):  [facility]ay Records from 8 AM - 3 PM Moving June 1 to [employer] at [facility] [address], [place],  ([place])   [place], [place]: [phone]) 8:30 - 12; 1 - 2:30  [facility] the [place],  [place], [place]    [phone]):8:30 - 12; 2 - 3PM  [facility] [place],  [address]  [place]; [phone]):   Mon - Fri 8 AM - 5 PM  [facility] [place]  MWF 12:30 to 3:00 or call to schedule an appointment  [phone]  Specific Provider options Psychology Today  https://www.psychologytoday.com/us 1. click on find a therapist  2. enter your zip code 3. left side and select or tailor a therapist for your specific need.   [facility] Provider Directory [email or web address]  (Medicaid)   Follow all drop down to find a provider  [place] [phone] or [email or web address] [address], [place], [place] Recovery support and educational   24- Hour Availability:  . [facility]   [phone] or [phone]  . [facility] the [facility] [phone]  [facility] Crisis Service  [phone]   . [facility] [employer]  [phone] (after hours)  . Therapeutic Alternative/Mobile Crisis   [phone]  . [place] National Suicide Hotline  [phone] (TALK)  . Call 911 or go to emergency room  . [employer]  [phone]);  [place] and [employer]   . [facility]  [phone]); [place], [place], [place], [place], [place],  [place], [place]

## 2019-10-29 ENCOUNTER — Encounter: Payer: Self-pay | Admitting: Family Medicine

## 2019-10-29 DIAGNOSIS — Z0000 Encounter for general adult medical examination without abnormal findings: Secondary | ICD-10-CM | POA: Insufficient documentation

## 2019-10-29 DIAGNOSIS — F411 Generalized anxiety disorder: Secondary | ICD-10-CM | POA: Insufficient documentation

## 2019-10-29 LAB — HEPATITIS C ANTIBODY: Hep C Virus Ab: <0.1 s/co ratio (ref 0.0–0.9)

## 2019-10-29 LAB — RPR: RPR Ser Ql: NONREACTIVE

## 2019-10-29 LAB — LIPID PANEL
Chol/HDL Ratio: 5.3 ratio — ABNORMAL HIGH (ref 0.0–4.4)
Cholesterol, Total: 203 mg/dL — ABNORMAL HIGH (ref 100–199)
HDL: 38 mg/dL — ABNORMAL LOW (ref >39)
LDL Chol Calc (NIH): 118 mg/dL — ABNORMAL HIGH (ref 0–99)
Triglycerides: 266 mg/dL — ABNORMAL HIGH (ref 0–149)
VLDL Cholesterol Cal: 47 mg/dL — ABNORMAL HIGH (ref 5–40)

## 2019-10-29 LAB — TSH: TSH: 1.36 u[IU]/mL (ref 0.450–4.500)

## 2019-10-29 LAB — HIV ANTIBODY (ROUTINE TESTING W REFLEX): HIV Screen 4th Generation wRfx: NONREACTIVE

## 2019-10-29 NOTE — Assessment & Plan Note (Signed)
Likely 2/2 uncontrolled acid reflux. Discussed avoiding known triggers and working towards dietary changes.  Will trial Protonix 40 mg daily, Tums/Pepcid PRN.  Follow-up in 1 month or sooner if worsening, however suspect it will take several weeks to see any improvement.

## 2019-10-29 NOTE — Assessment & Plan Note (Signed)
Performed Pap smear with STD testing (HIV, RPR, hep C, gc/ch, trichomonas).  Additionally obtained screening A1c and lipid panel due to elevated BMI.  Encouraged her to continue working towards well-balanced diet and increasing physical activity.

## 2019-10-29 NOTE — Assessment & Plan Note (Addendum)
GAD-7 score 21, significantly impacting her quality of life.  No concurrent SI/HI.  Will obtain TSH to rule out hyperthyroidism, however less likely without concurrent concerns.  Provided with 24-hour crisis hotline and therapy resources to establish care in the community.  Rx'd Zoloft 50 mg and hydroxyzine PRN acute anxiety in the interim as Zoloft takes effect.  F/U in 1 month or sooner if needed.

## 2019-10-30 ENCOUNTER — Other Ambulatory Visit: Payer: Self-pay | Admitting: Family Medicine

## 2019-10-30 ENCOUNTER — Telehealth: Payer: Self-pay | Admitting: *Deleted

## 2019-10-30 DIAGNOSIS — F411 Generalized anxiety disorder: Secondary | ICD-10-CM

## 2019-10-30 LAB — CYTOLOGY - PAP
Chlamydia: NEGATIVE
Comment: NEGATIVE
Comment: NEGATIVE
Comment: NORMAL
Neisseria Gonorrhea: NEGATIVE
Trichomonas: NEGATIVE

## 2019-10-30 MED ORDER — ESCITALOPRAM OXALATE 10 MG PO TABS: 10.0000 mg | ORAL_TABLET | Freq: Every day | ORAL | 1 refills | Status: AC

## 2019-10-30 NOTE — Telephone Encounter (Signed)
Pt calls to report a reaction to Zoloft:  She took the 1st pill on Monday after her appt (6/21) @ 4pm.  2nd pill was taken Tuesday morning @ 9am.  By 3pm she reports that she was shaky, felt weird, racing heart (to fast for her to count) and brurning skin.  This lasted for about 1.5 hours.  She took the hydroxizine which kicked in about 30 minutes later.    She has not taken the zoloft this am and wants to discuss with Dr. Annia Friendly before she does again. Jone Baseman, CMA

## 2019-10-30 NOTE — Telephone Encounter (Signed)
Discussed concerns with patient over the phone.  Will trial Lexapro instead, sent in 10mg  tablets.  Finding great benefit from hydroxyzine.   , DO

## 2019-11-18 ENCOUNTER — Telehealth: Payer: Self-pay

## 2019-11-18 ENCOUNTER — Other Ambulatory Visit: Payer: Self-pay | Admitting: Family Medicine

## 2019-11-18 DIAGNOSIS — F411 Generalized anxiety disorder: Secondary | ICD-10-CM

## 2019-11-18 MED ORDER — HYDROXYZINE HCL 25 MG PO TABS: 25.0000 mg | ORAL_TABLET | Freq: Three times a day (TID) | ORAL | 0 refills | Status: AC | PRN

## 2019-11-18 NOTE — Telephone Encounter (Signed)
Patient calls nurse line regarding anxiety medication management. Patient reports that she was recently seen in the ED in [place] for dizziness related to anxiety. Patient states that the ED advised following up with PCP for anxiety management, as they believe her anxiety is what is causing the dizziness. Patient reports that she is not currently taking any medication for anxiety, as she has had bad reactions with Zoloft and Lexapro.   Scheduled patient for first available with PCP for follow up on August 2nd. Please advise if you would like patient to be seen sooner in clinic with another provider. ED precautions given.  To PCP  Veronda Prude, RN

## 2019-11-18 NOTE — Telephone Encounter (Signed)
Call patient to briefly discuss.  States anxiety is becoming so debilitating that she cannot go back to work right now.  Having frequent panic attacks.  Currently she is not interested in trying another medicine after bad reactions with Zoloft and Lexapro, does do well with hydroxyzine.  No SI/HI.  Provided with psychiatry resources to establish care, she is going to call to establish therapy ASAP tomorrow as well.  Will send an increased dose of hydroxyzine.  Allayne Stack, DO

## 2019-12-09 ENCOUNTER — Encounter: Payer: Self-pay | Admitting: Family Medicine

## 2019-12-09 ENCOUNTER — Ambulatory Visit: Payer: Medicaid Other | Admitting: Family Medicine

## 2019-12-09 ENCOUNTER — Other Ambulatory Visit: Payer: Self-pay

## 2019-12-09 VITALS — BP 124/88 | HR 93 | Wt 229.2 lb

## 2019-12-09 DIAGNOSIS — K219 Gastro-esophageal reflux disease without esophagitis: Secondary | ICD-10-CM

## 2019-12-09 DIAGNOSIS — F411 Generalized anxiety disorder: Secondary | ICD-10-CM

## 2019-12-09 MED ORDER — BUSPIRONE HCL 5 MG PO TABS: 5.0000 mg | ORAL_TABLET | Freq: Two times a day (BID) | ORAL | 0 refills | Status: DC

## 2019-12-09 NOTE — Assessment & Plan Note (Addendum)
Felt Protonix made her "feel weird." Discussed food modifications and trialing Pepcid daily instead. She also reports cessation of alcohol use which will likely substantially help. May use Tums/Gaviscon as needed for flares.

## 2019-12-09 NOTE — Patient Instructions (Addendum)
For your reflux: try famotidine/pepcid 20mg  once daily, can do twice daily if needed. Can also do tums or gaviscon for flare ups.   Additionally-you can try buspar tomorrow since you have done well with this before. I have sent in a low dose to start.

## 2019-12-09 NOTE — Progress Notes (Signed)
SUBJECTIVE:   CHIEF COMPLAINT / HPI: Follow-up anxiety  Anxiety: GAD-7 score 21 during last visit in 10/2019.  She has tried Zoloft and Lexapro with reported adverse effects including burning skin, increased anxiety, dizziness.  Currently using intermittent hydroxyzine only.  Last spoke via phone on 7/12 and discussed establishing with counseling and psychiatry at that time.  Today, she is doing okay. Feeling very anxious. No longer dizzy.  Still having frequent panic attacks at home where she feels restless, short of breath, and shaky.  Better with relaxation techniques.  Reports she got established with therapy and already had her first session. Going well, can't have medical management until 8/23, however she is very hesitant about medication and not sure if she wants to start one. [facility]. States she has been diagnosed with bipolar disorder in the past as a teenager ([age]) but she can't remember the physician. Was placed on buspar at that time and did well with this with the exception of feeling things crawl on her skin. Has a support group online that has been helpful. She also reports one of her friends recently passed away last week, grieving but managing well.   Mood questionnaire: Answered yes to 6 of the questions in #1, answered no to #2, reports it is a minor problem to #3.  Reports her blood relatives have had bipolar disorder and health professional has told her that she has bipolar disorder in the past.    Office Visit from 12/09/2019 in [facility]  Thoughts that you would be better off dead, or of hurting yourself in some way Not at all  PHQ-9 Total Score 6     GAD 7 : Generalized Anxiety Score 12/09/2019 10/28/2019  Nervous, Anxious, on Edge 3 3  Control/stop worrying 3 3  Worry too much - different things 2 3  Trouble relaxing 2 3  Restless 2 3  Easily annoyed or irritable 2 3  Afraid - awful might happen 2 3    Total GAD 7 Score 16 21  Anxiety Difficulty Somewhat difficult Extremely difficult    Due for covid vaccine, interested in this but would like to wait under her anxiety is under better control.  PERTINENT  PMH / PSH: Elevated BMI (36), chronic cough felt to be 2/2 GERD   OBJECTIVE:   BP 124/88   Pulse 93   Wt 229 lb 3.2 oz (104 kg)   SpO2 97%   BMI 36.40 kg/m   General: Alert, NAD HEENT: NCAT, MMM Cardiac: RRR no m/g/r Lungs: Clear bilaterally, no increased WOB, no wheezing noted Msk: Moves all extremities spontaneously  Ext: Warm, dry Psych: Anxious mood and affect  ASSESSMENT/PLAN:   GAD (generalized anxiety disorder) Chronic, uncontrolled.  Gad-7 score 16 today, improved from 21 in 10/2019.  She has failed several medication trials including Zoloft, Lexapro, hydroxyzine.  She recently established with therapy and will be receiving medical management starting later this month.  Reports a history of possible diagnosed bipolar disorder in the past, do wonder if this is contributing.  Given she has had success with BuSpar previously, will trial this in the interim until she can see psychiatry, she is hesitant to consider alternative medications at this time.  Continue therapy and working with support group.  GERD (gastroesophageal reflux disease) Felt Protonix made her "feel weird." Discussed food modifications and trialing Pepcid daily instead. She also reports cessation of alcohol use which will likely substantially help.  May use Tums/Gaviscon as needed for flares.    Follow-up in 1 month or sooner if needed.  Allayne Stack, DO [facility]

## 2019-12-09 NOTE — Assessment & Plan Note (Addendum)
Chronic, uncontrolled.  Gad-7 score 16 today, improved from 21 in 10/2019.  She has failed several medication trials including Zoloft, Lexapro, hydroxyzine.  She recently established with therapy and will be receiving medical management starting later this month.  Reports a history of possible diagnosed bipolar disorder in the past, do wonder if this is contributing.  Given she has had success with BuSpar previously, will trial this in the interim until she can see psychiatry, she is hesitant to consider alternative medications at this time.  Continue therapy and working with support group.

## 2019-12-11 ENCOUNTER — Encounter: Payer: Self-pay | Admitting: Family Medicine

## 2019-12-19 ENCOUNTER — Encounter: Payer: Self-pay | Admitting: Family Medicine

## 2019-12-19 ENCOUNTER — Telehealth: Payer: Self-pay

## 2019-12-19 ENCOUNTER — Other Ambulatory Visit: Payer: Self-pay | Admitting: Family Medicine

## 2019-12-19 DIAGNOSIS — F411 Generalized anxiety disorder: Secondary | ICD-10-CM

## 2019-12-19 MED ORDER — BUSPIRONE HCL 10 MG PO TABS: 10.0000 mg | ORAL_TABLET | Freq: Two times a day (BID) | ORAL | 0 refills | Status: DC

## 2019-12-19 NOTE — Telephone Encounter (Signed)
Patient calls nurse line per provider instruction to follow up on how medications are working. Patient states that the pepcid is working well and buspar was working well initially. Patient reports that she has noticed a decrease in desired effect of medication as she is coming up on two weeks of taking medication. Patient is requesting an increased dosage to possibly 10 mg of buspar. Patient states that she has an appointment with psychiatrist on 8/23, however, she will run out of medication on Tuesday, 8/17.   Please advise if patient can receive additional refill on medication  To PCP  Veronda Prude, RN

## 2020-01-08 ENCOUNTER — Encounter (HOSPITAL_COMMUNITY): Payer: Self-pay | Admitting: Psychiatry

## 2020-01-08 ENCOUNTER — Other Ambulatory Visit: Payer: Self-pay

## 2020-01-08 ENCOUNTER — Telehealth (HOSPITAL_COMMUNITY): Payer: Medicaid Other | Admitting: Psychiatry

## 2020-01-15 IMAGING — US US OB COMP LESS 14 WK
1 series · 15 of 28 positions shown · non-contrast
Comparison: None.

CLINICAL DATA: Cramping, spotting

EXAM:
OBSTETRIC <14 WK US AND TRANSVAGINAL OB US
TECHNIQUE: Both transabdominal and transvaginal ultrasound examinations were
performed for complete evaluation of the gestation as well as the
maternal uterus, adnexal regions, and pelvic cul-de-sac.
Transvaginal technique was performed to assess early pregnancy.

[Series 1: us ob comp less 14 wk · 48 acquisitions, 15 frames shown]
[im 1/48]
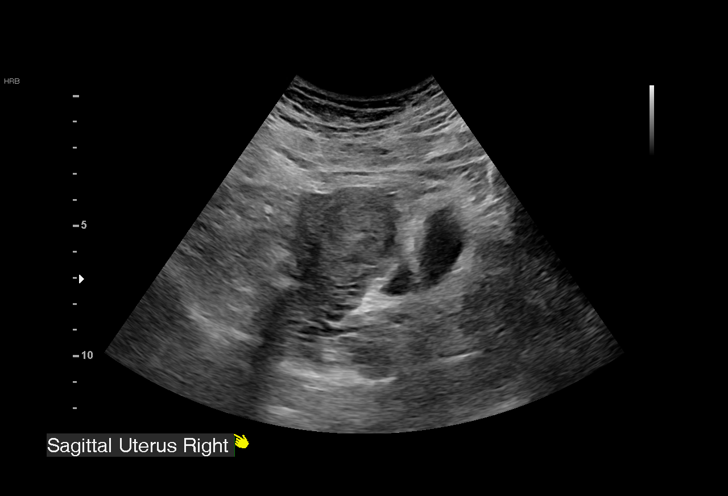
[im 4/48]
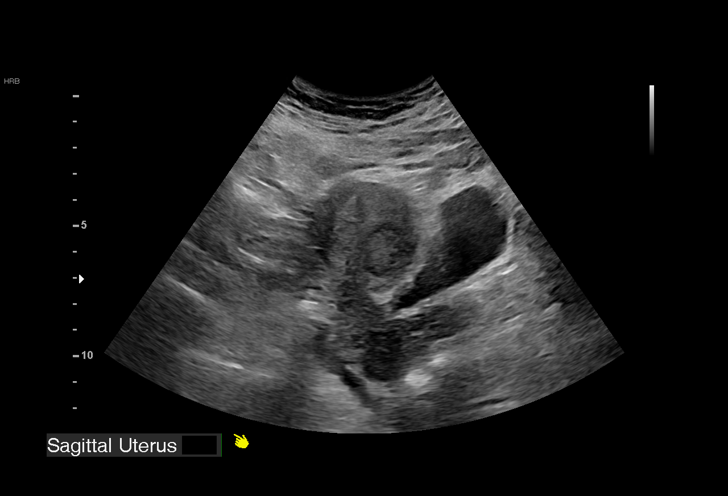
[im 7/48]
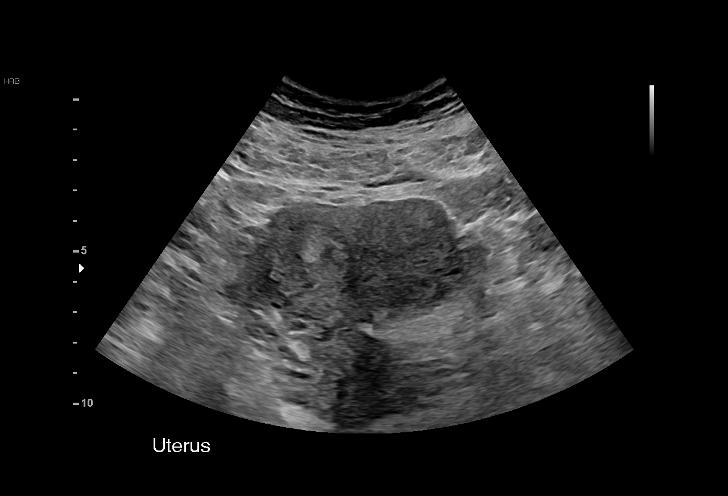
[im 11/48]
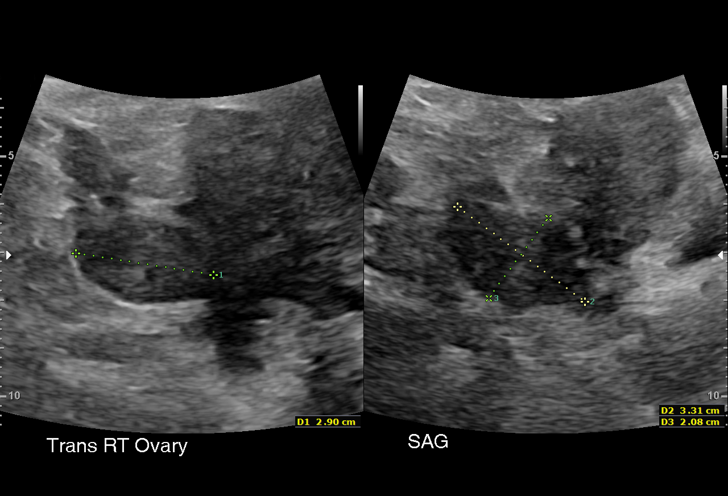
[im 14/48]
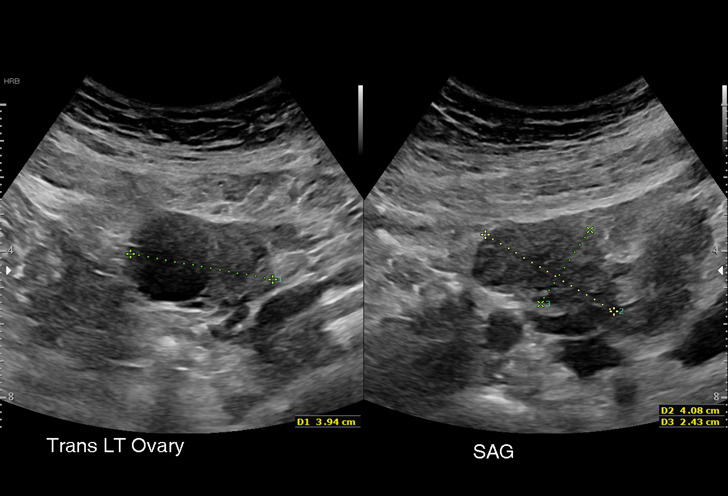
[im 18/48]
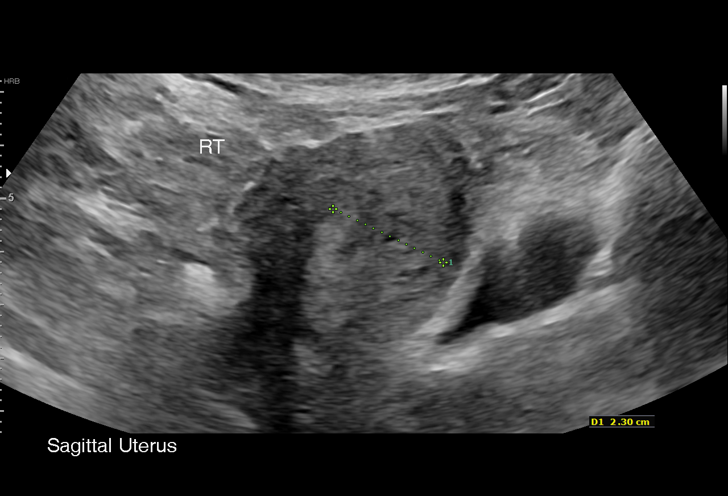
[im 21/48]
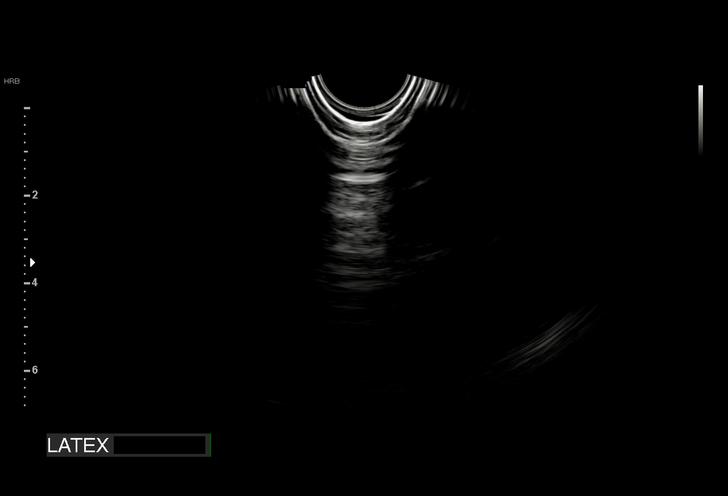
[im 25/48]
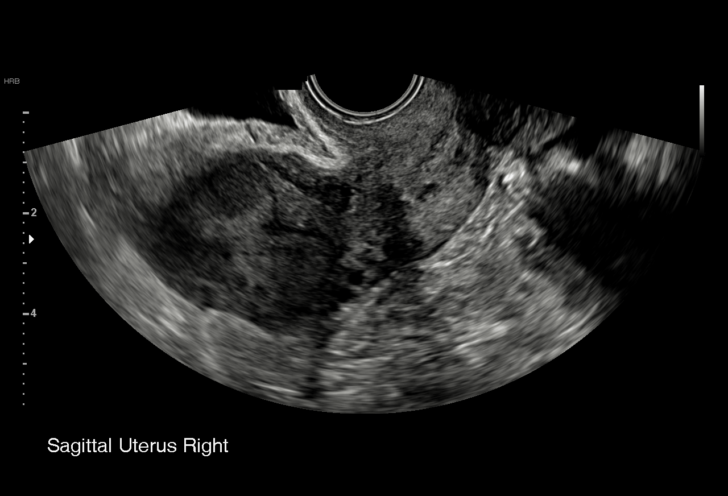
[im 27/48]
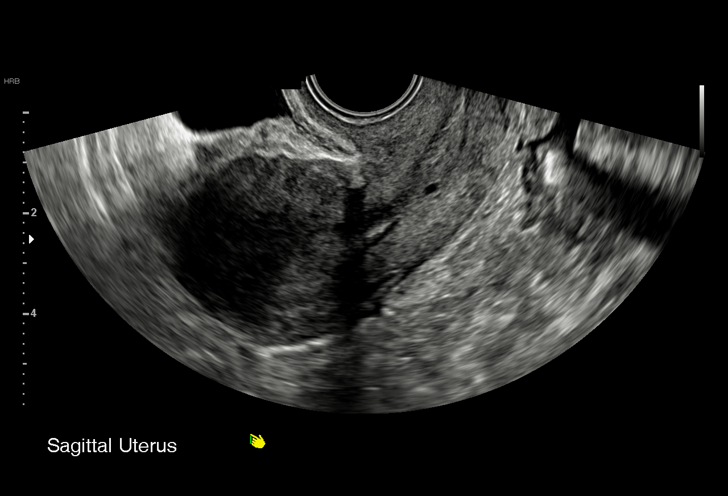
[im 30/48]
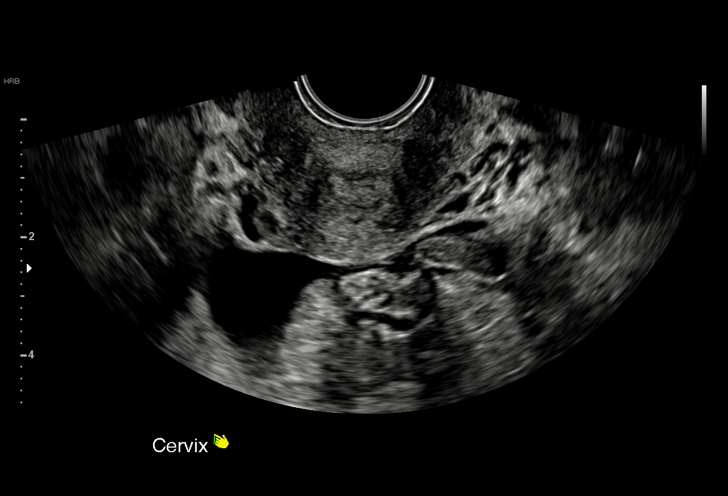
[im 34/48]
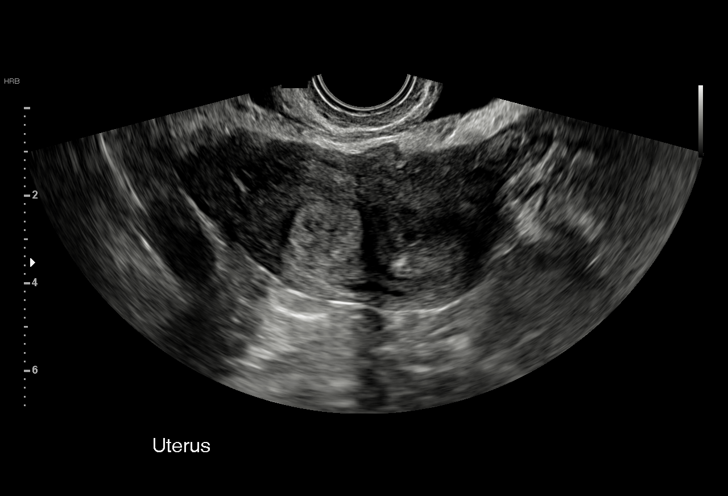
[im 37/48]
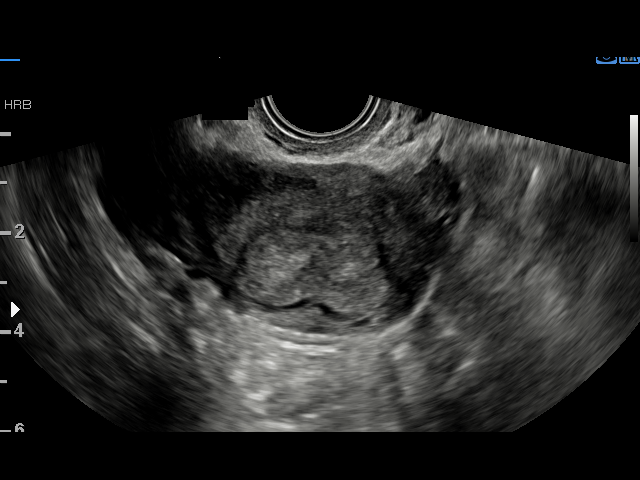
[im 41/48]
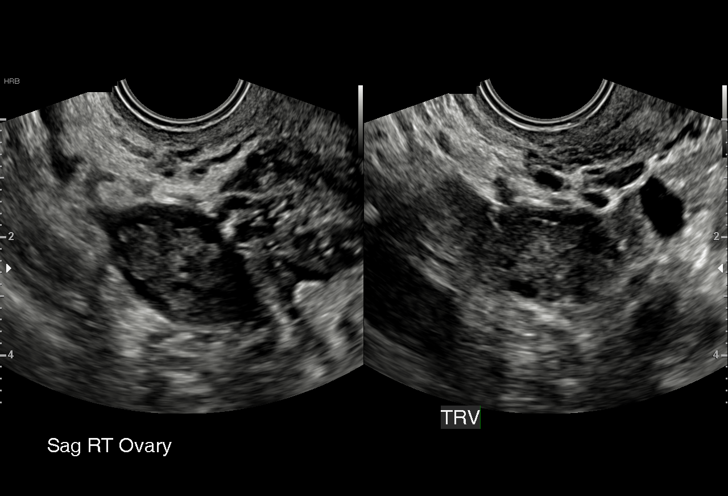
[im 44/48]
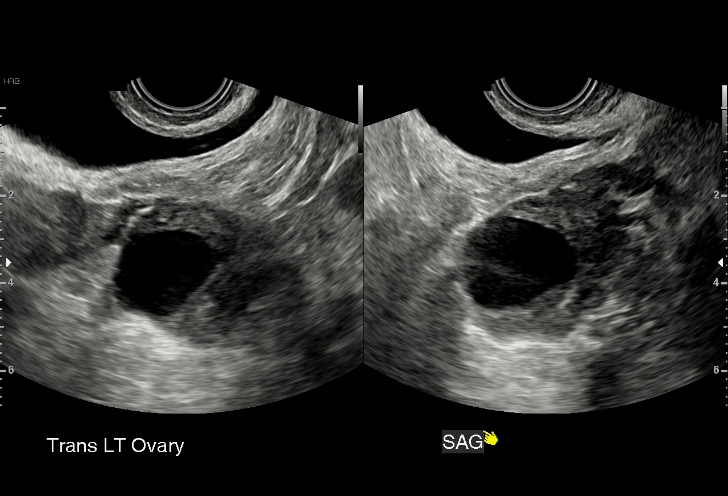
[im 48/48]
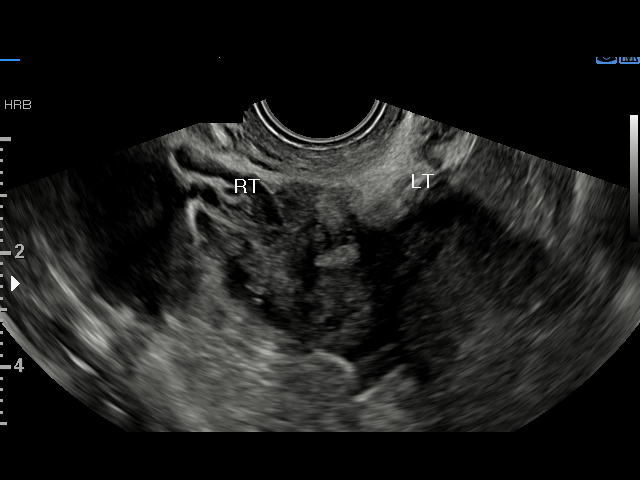

[15 of 28 positions shown; findings below may reference images not displayed]

FINDINGS: Intrauterine gestational sac: None

Yolk sac:  Not visualized

Embryo:  Not visualized

Cardiac Activity: Not visualized

Heart Rate:   bpm

MSD:   mm    w     d

CRL:    mm    w    d                  US EDC:

Subchorionic hemorrhage:  None visualized.

Maternal uterus/adnexae: No adnexal mass. Small amount of free
fluid. There is a bicornuate uterus noted.
IMPRESSION: No intrauterine gestation visualized.

Bicornuate uterus.

Small amount of free fluid in the pelvis.

## 2020-01-20 ENCOUNTER — Other Ambulatory Visit: Payer: Self-pay

## 2020-01-20 DIAGNOSIS — F411 Generalized anxiety disorder: Secondary | ICD-10-CM

## 2020-01-20 MED ORDER — BUSPIRONE HCL 10 MG PO TABS: 10.0000 mg | ORAL_TABLET | Freq: Two times a day (BID) | ORAL | 0 refills | Status: DC

## 2020-01-22 ENCOUNTER — Other Ambulatory Visit: Payer: Self-pay | Admitting: *Deleted

## 2020-01-22 DIAGNOSIS — F411 Generalized anxiety disorder: Secondary | ICD-10-CM

## 2020-01-26 IMAGING — US US OB TRANSVAGINAL
1 series · 15 of 28 positions shown · non-contrast
Comparison: 04/06/2018

CLINICAL DATA: Pregnancy of unknown anatomic location.

EXAM:
TRANSVAGINAL OB ULTRASOUND
TECHNIQUE: Transvaginal ultrasound was performed for complete evaluation of the
gestation as well as the maternal uterus, adnexal regions, and
pelvic cul-de-sac.

[Series 1: us ob transvaginal · 73 acquisitions, 15 frames shown]
[im 1/73]
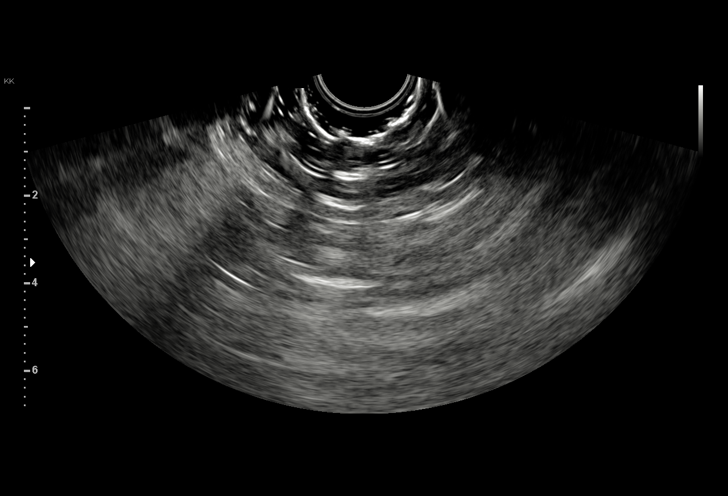
[im 6/73]
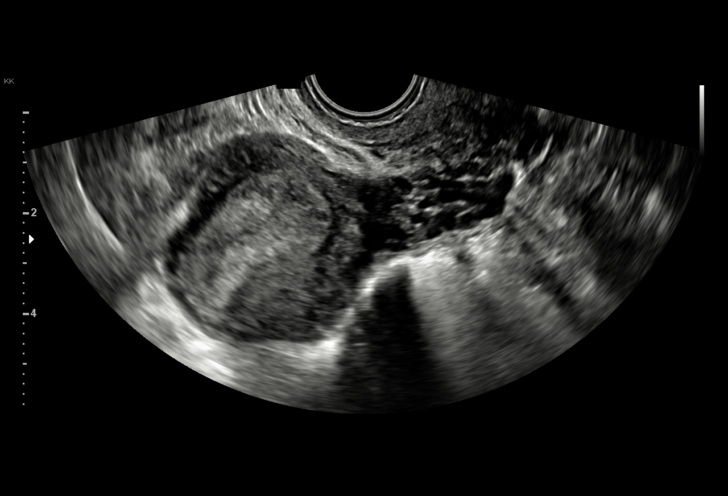
[im 11/73]
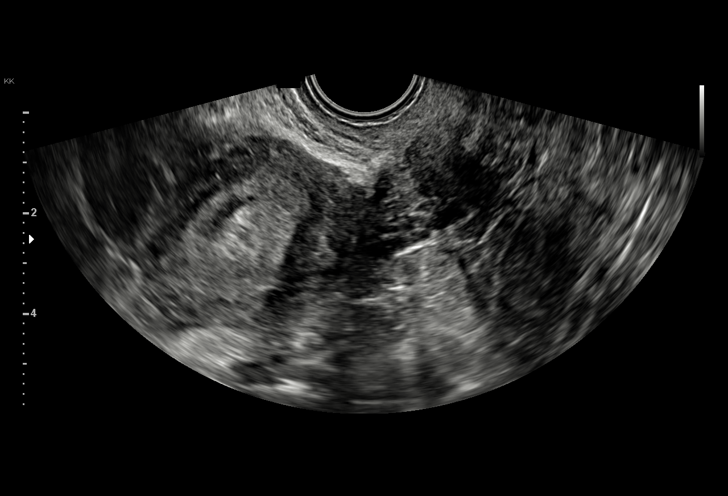
[im 17/73]
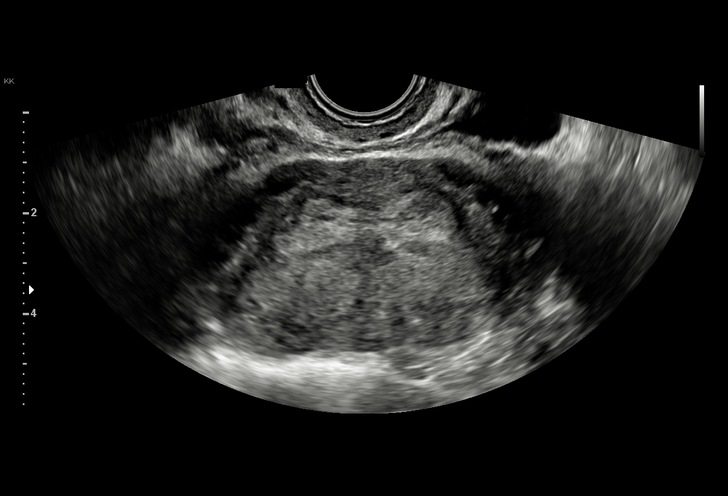
[im 22/73]
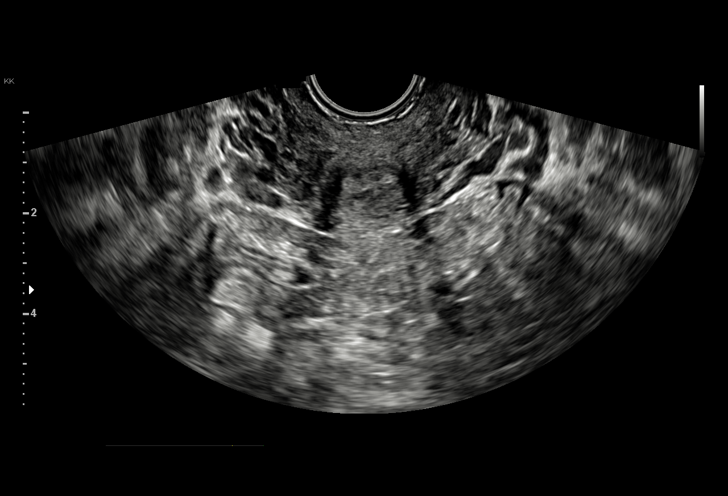
[im 27/73]
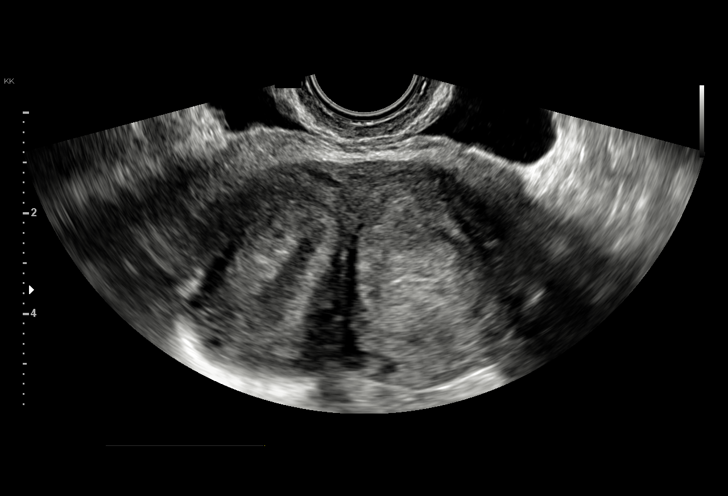
[im 33/73]
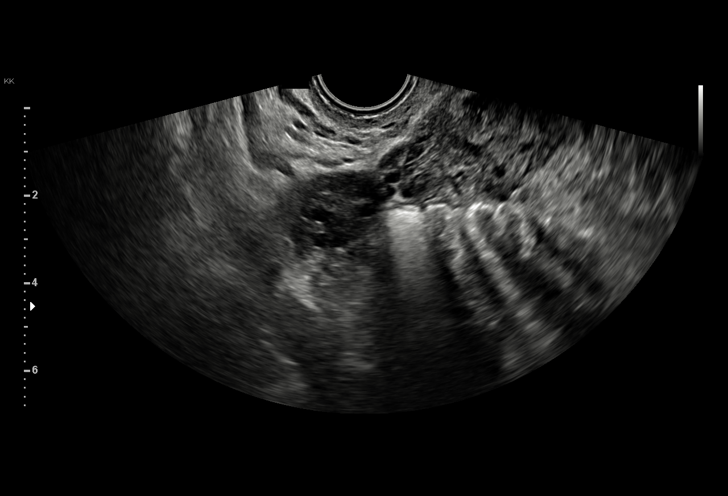
[im 38/73]
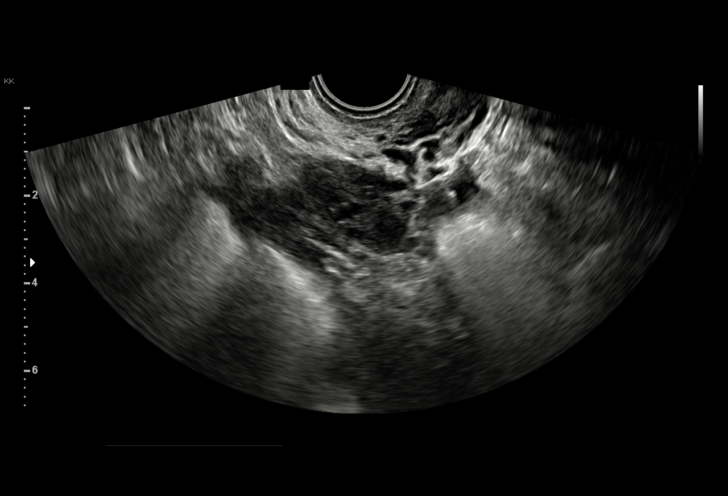
[im 41/73]
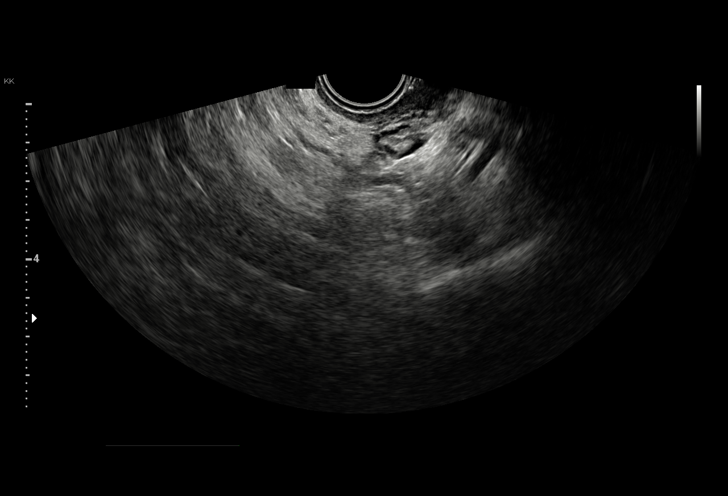
[im 46/73]
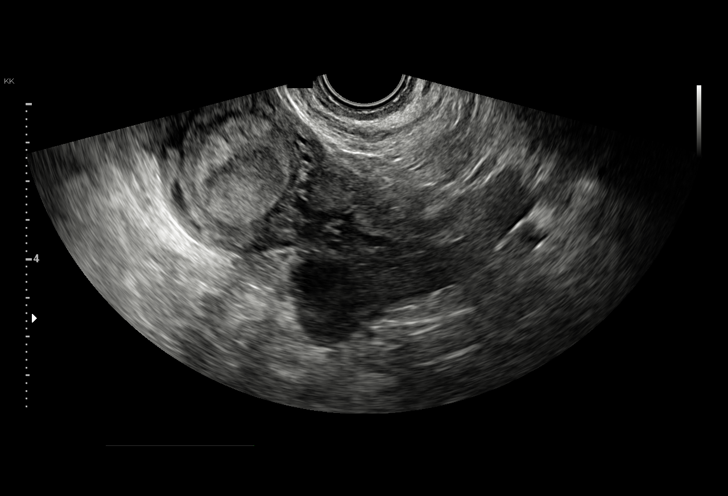
[im 51/73]
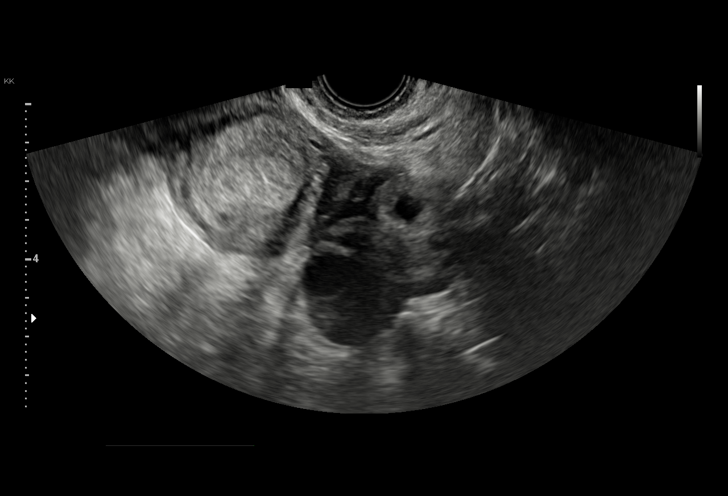
[im 57/73]
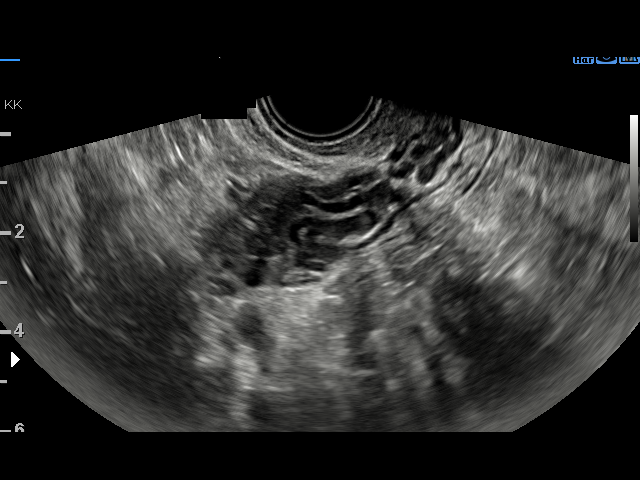
[im 62/73]
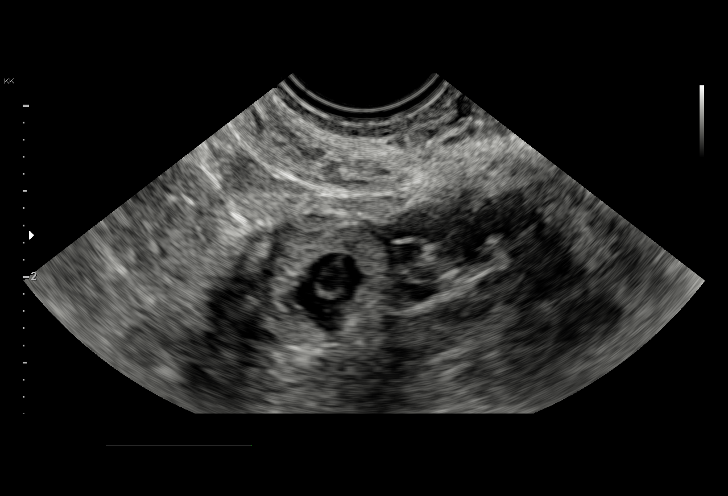
[im 67/73]
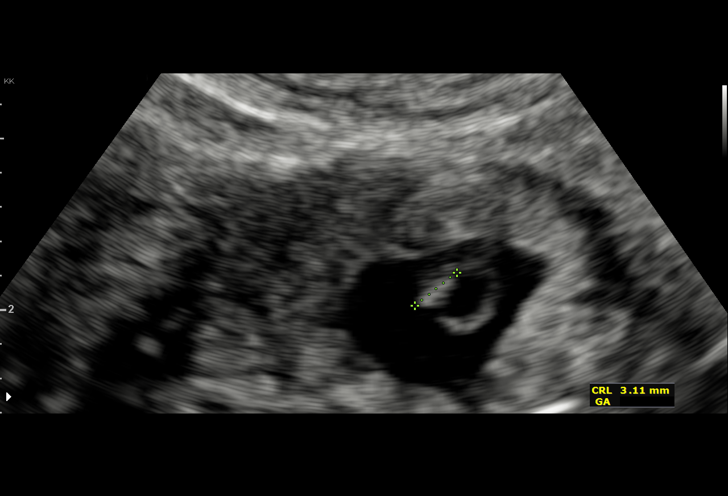
[im 73/73]
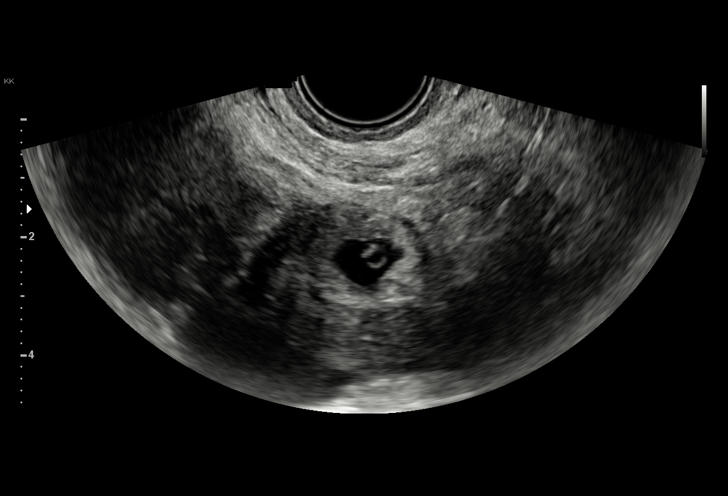

[15 of 28 positions shown; findings below may reference images not displayed]

FINDINGS: Intrauterine gestational sac: None

Yolk sac:  Visualized in left adnexa

Embryo:  Visualized and left adnexa

Cardiac Activity: Visualized

Heart Rate: 136 bpm

MSD:   mm    w     d

CRL:   3.2 mm   5 w 6 d                  US EDC: 12/12/2018

Subchorionic hemorrhage:  None visualized.

Maternal uterus/adnexae: Bicornuate uterus noted. No intrauterine
gestational sac. There is a live ectopic pregnancy immediately
adjacent to the left ovary which measures 2.1 x 1.7 x 1.6 cm. Small
amount of free fluid in the pelvis.
IMPRESSION: Live ectopic pregnancy in the left adnexa immediately adjacent to
the left ovary measuring up to 2.1 cm with a fetal heart rate 136
beats per minute.

Small amount of free fluid in the pelvis.

Critical Value/emergent results were called by telephone at the time
of interpretation on 04/17/2018 at [DATE] to Dr. Maria De Jesus, who
verbally acknowledged these results.

## 2020-02-14 ENCOUNTER — Telehealth: Payer: Self-pay

## 2020-02-14 ENCOUNTER — Other Ambulatory Visit (HOSPITAL_COMMUNITY)
Admission: RE | Admit: 2020-02-14 | Discharge: 2020-02-14 | Disposition: A | Discharge status: Home / Self Care | Payer: Medicaid Other | Source: Ambulatory Visit | Attending: Family Medicine | Admitting: Family Medicine

## 2020-02-14 ENCOUNTER — Ambulatory Visit (INDEPENDENT_AMBULATORY_CARE_PROVIDER_SITE_OTHER): Payer: Medicaid Other | Admitting: Student in an Organized Health Care Education/Training Program

## 2020-02-14 ENCOUNTER — Other Ambulatory Visit: Payer: Self-pay

## 2020-02-14 VITALS — BP 125/80 | HR 83 | Ht 66.0 in | Wt 237.4 lb

## 2020-02-14 DIAGNOSIS — N898 Other specified noninflammatory disorders of vagina: Secondary | ICD-10-CM

## 2020-02-14 DIAGNOSIS — R300 Dysuria: Secondary | ICD-10-CM | POA: Diagnosis not present

## 2020-02-14 LAB — POCT WET PREP (WET MOUNT)
Clue Cells Wet Prep Whiff POC: NEGATIVE
Trichomonas Wet Prep HPF POC: ABSENT

## 2020-02-14 LAB — POCT URINALYSIS DIP (CLINITEK)
Bilirubin, UA: NEGATIVE
Glucose, UA: NEGATIVE mg/dL
Ketones, POC UA: NEGATIVE mg/dL
Nitrite, UA: NEGATIVE
POC PROTEIN,UA: NEGATIVE
Spec Grav, UA: 1.02 (ref 1.010–1.025)
Urobilinogen, UA: 0.2 E.U./dL
pH, UA: 7 (ref 5.0–8.0)

## 2020-02-14 LAB — POCT URINE PREGNANCY: Preg Test, Ur: NEGATIVE

## 2020-02-14 MED ORDER — FLUCONAZOLE 150 MG PO TABS: 150.0000 mg | ORAL_TABLET | Freq: Once | ORAL | 0 refills | Status: AC

## 2020-02-14 NOTE — Telephone Encounter (Signed)
Patient calls nurse line to inform provider that she is unable to activate mychart and will need to receive phone call once results are in.   To Dr. Dareen Piano.   Veronda Prude, RN

## 2020-02-14 NOTE — Patient Instructions (Signed)
It was a pleasure to see you today!  To summarize our discussion for this visit:  You likely have a yeast infection. I will send in treatment to your pharmacy and will confirm with the swab in lab. We also screened for STDs and urinary tract infection. Check mychart for your results.   There are OTC anti-itch creams that can help give you some relief in the interim until your treatment kicks in.  Some additional health maintenance measures we should update are: Health Maintenance Due  Topic Date Due  . COVID-19 Vaccine (1) Never done  . TETANUS/TDAP  Never done  . INFLUENZA VACCINE  12/08/2019  .   Call the clinic at [phone] if your symptoms worsen or you have any concerns.   Thank you for allowing me to take part in your care,  Dr. Jamelle Rushing

## 2020-02-14 NOTE — Progress Notes (Signed)
   SUBJECTIVE:   CHIEF COMPLAINT / HPI:   1week of burning with urination, frequency, urgency. Drinking a lot of water that helped. No back/flank pain, or fever. Had multiple in the past and this is similar. Has not taken anything for it at home. Ibuprofen for pain. No bumps or lesions on external genitalia.  Yesterday had white discharge with vaginal itching. Soaked in bath last night which helped. Does not normally bathe. Does not douche. Has had in the past.   Last period about 3 weeks ago and was 3 days long. Does not have a normal period usually. Sexually active. No contraception. Occasionally uses condoms and used a latex condom on accident although she is allergic to latex and breaks out with even casual contact with latex usually. No concerns for STDs.  OBJECTIVE:   BP 125/80   Pulse 83   Ht 5\' 6"  (1.676 m)   Wt 237 lb 6.4 oz (107.7 kg)   SpO2 99%   BMI 38.32 kg/m   General: NAD, pleasant, able to participate in exam Cardiac: RRR, normal heart sounds, no murmurs. 2+ radial and PT pulses bilaterally Respiratory: CTAB, normal effort, No wheezes, rales or rhonchi Abdomen: soft, nontender, nondistended, no hepatic or splenomegaly, +BS Extremities: no edema or cyanosis. WWP. Skin: warm and dry, no rashes noted Neuro: alert and oriented x4, no focal deficits Psych: Normal affect and mood   ASSESSMENT/PLAN:   Dysuria Urinalysis, wet prep, gc/chlamydia swab - positive yeast and gonorrhea. Prescribed diflucan, notified that she will have to come in for treatment of gonorrhea for nurse visit and partner treatment as well. Order placed. - upreg negative - urinalysis should be repeated when infection resolved to follow resolution of heme in urine     , DO [facility]

## 2020-02-16 LAB — CERVICOVAGINAL ANCILLARY ONLY
Chlamydia: NEGATIVE
Comment: NEGATIVE
Comment: NORMAL
Neisseria Gonorrhea: POSITIVE — AB

## 2020-02-17 ENCOUNTER — Other Ambulatory Visit: Payer: Self-pay

## 2020-02-17 ENCOUNTER — Ambulatory Visit (INDEPENDENT_AMBULATORY_CARE_PROVIDER_SITE_OTHER): Payer: Medicaid Other

## 2020-02-17 DIAGNOSIS — A549 Gonococcal infection, unspecified: Secondary | ICD-10-CM | POA: Diagnosis present

## 2020-02-17 DIAGNOSIS — R300 Dysuria: Secondary | ICD-10-CM | POA: Insufficient documentation

## 2020-02-17 MED ORDER — CEFTRIAXONE SODIUM 1 G IJ SOLR: 1.0000 g | Freq: Once | INTRAMUSCULAR | Status: DC

## 2020-02-17 MED ORDER — CEFTRIAXONE SODIUM 250 MG IJ SOLR
250.0000 mg | Freq: Once | INTRAMUSCULAR | Status: AC
  Administered 2020-02-17: 250 mg via INTRAMUSCULAR

## 2020-02-17 NOTE — Progress Notes (Signed)
Patient in nurse clinic today for STD treatment of Gonorrhea.    Patient advised to abstain from sex for 7-10 days after treatment of self and partner.    Ceftriaxone 250 mg IM x 2 given in LUOQ and RUOQ. Patient observed 15 minutes in office.  No reaction noted.   Patient to follow up in 2-3 months for re-screening.    Provided condoms and advised to use with all sexual activity. Patient verbalized understanding.   STD report form fax completed and faxed to [facility] Department at [phone] (STD department).

## 2020-02-17 NOTE — Assessment & Plan Note (Signed)
Urinalysis, wet prep, gc/chlamydia swab - positive yeast and gonorrhea. Prescribed diflucan, notified that she will have to come in for treatment of gonorrhea for nurse visit and partner treatment as well. Order placed. - upreg negative - urinalysis should be repeated when infection resolved to follow resolution of heme in urine

## 2020-02-17 NOTE — Telephone Encounter (Signed)
Patient calls nurse line requesting lab results. Patient informed of positive Gonorrhea and nurse apt scheduled for treatment for this afternoon. Please place epic order.

## 2020-02-20 ENCOUNTER — Other Ambulatory Visit: Payer: Self-pay

## 2020-02-20 DIAGNOSIS — F411 Generalized anxiety disorder: Secondary | ICD-10-CM

## 2020-02-20 MED ORDER — BUSPIRONE HCL 10 MG PO TABS: 10.0000 mg | ORAL_TABLET | Freq: Two times a day (BID) | ORAL | 1 refills | Status: AC

## 2020-02-20 NOTE — Telephone Encounter (Signed)
Patient calls nurse line requesting PCP to send in a refill for Buspar. Patient reports she is having a hard time with her PSY provider, as her previous provider left. Patient reports she has been having a hard time finding anyone who takes medicaid, or the office are "overwhelmed." Please advise on refill.

## 2020-04-07 ENCOUNTER — Other Ambulatory Visit: Payer: Self-pay

## 2020-04-07 ENCOUNTER — Emergency Department (HOSPITAL_COMMUNITY)
Admission: EM | Admit: 2020-04-07 | Discharge: 2020-04-07 | Disposition: A | Discharge status: Home / Self Care | Payer: Medicaid Other | Attending: Emergency Medicine | Admitting: Emergency Medicine

## 2020-04-07 ENCOUNTER — Encounter (HOSPITAL_COMMUNITY): Payer: Self-pay | Admitting: Emergency Medicine

## 2020-04-07 DIAGNOSIS — N939 Abnormal uterine and vaginal bleeding, unspecified: Secondary | ICD-10-CM | POA: Diagnosis not present

## 2020-04-07 DIAGNOSIS — I10 Essential (primary) hypertension: Secondary | ICD-10-CM | POA: Diagnosis not present

## 2020-04-07 DIAGNOSIS — R110 Nausea: Secondary | ICD-10-CM | POA: Diagnosis not present

## 2020-04-07 DIAGNOSIS — Z87891 Personal history of nicotine dependence: Secondary | ICD-10-CM | POA: Diagnosis not present

## 2020-04-07 LAB — CBC WITH DIFFERENTIAL/PLATELET
Abs Immature Granulocytes: 0.04 10*3/uL (ref 0.00–0.07)
Basophils Absolute: 0.1 10*3/uL (ref 0.0–0.1)
Basophils Relative: 0 %
Eosinophils Absolute: 0.1 10*3/uL (ref 0.0–0.5)
Eosinophils Relative: 1 %
HCT: 43.5 % (ref 36.0–46.0)
Hemoglobin: 13.5 g/dL (ref 12.0–15.0)
Immature Granulocytes: 0 %
Lymphocytes Relative: 25 %
Lymphs Abs: 2.8 10*3/uL (ref 0.7–4.0)
MCH: 28.2 pg (ref 26.0–34.0)
MCHC: 31 g/dL (ref 30.0–36.0)
MCV: 91 fL (ref 80.0–100.0)
Monocytes Absolute: 0.8 10*3/uL (ref 0.1–1.0)
Monocytes Relative: 7 %
Neutro Abs: 7.5 10*3/uL (ref 1.7–7.7)
Neutrophils Relative %: 67 %
Platelets: 401 10*3/uL — ABNORMAL HIGH (ref 150–400)
RBC: 4.78 MIL/uL (ref 3.87–5.11)
RDW: 13.7 % (ref 11.5–15.5)
WBC: 11.2 10*3/uL — ABNORMAL HIGH (ref 4.0–10.5)
nRBC: 0 % (ref 0.0–0.2)

## 2020-04-07 LAB — WET PREP, GENITAL
Clue Cells Wet Prep HPF POC: NONE SEEN
Sperm: NONE SEEN
Trich, Wet Prep: NONE SEEN
Yeast Wet Prep HPF POC: NONE SEEN

## 2020-04-07 LAB — BASIC METABOLIC PANEL
Anion gap: 10 (ref 5–15)
BUN: 9 mg/dL (ref 6–20)
CO2: 25 mmol/L (ref 22–32)
Calcium: 9.3 mg/dL (ref 8.9–10.3)
Chloride: 102 mmol/L (ref 98–111)
Creatinine, Ser: 0.85 mg/dL (ref 0.44–1.00)
GFR, Estimated: >60 mL/min (ref >60)
Glucose, Bld: 92 mg/dL (ref 70–99)
Potassium: 3.9 mmol/L (ref 3.5–5.1)
Sodium: 137 mmol/L (ref 135–145)

## 2020-04-07 LAB — URINALYSIS, ROUTINE W REFLEX MICROSCOPIC
Bilirubin Urine: NEGATIVE
Glucose, UA: NEGATIVE mg/dL
Ketones, ur: NEGATIVE mg/dL
Leukocytes,Ua: NEGATIVE
Nitrite: NEGATIVE
Protein, ur: NEGATIVE mg/dL
RBC / HPF: >50 RBC/hpf — ABNORMAL HIGH (ref 0–5)
Specific Gravity, Urine: 1.006 (ref 1.005–1.030)
pH: 8 (ref 5.0–8.0)

## 2020-04-07 LAB — I-STAT BETA HCG BLOOD, ED (MC, WL, AP ONLY): I-stat hCG, quantitative: <5 m[IU]/mL (ref <5)

## 2020-04-07 NOTE — ED Provider Notes (Signed)
And and plan [facility] EMERGENCY DEPARTMENT Provider Note   CSN: [id number] Arrival date & time: 04/07/20  1109     History Chief Complaint  Patient presents with  . Vaginal Bleeding    Jennifer Harding is a 24 y.o. female who presents emergency department chief complaint of vaginal bleeding.  Patient states that she normally has very regular periods.  She has been spotting for the past 2 weeks and today had a gush of blood from her vagina.  She was concerned about this because she has not had any real cramping over the past 2 weeks but now does and has a history of previous ectopic pregnancy.  She is married and is not using any form of birth control at this point.  HPI     Past Medical History:  Diagnosis Date  . Hematuria   . Hypertension   . Left tubal pregnancy without intrauterine pregnancy 04/17/2018  . Pregnancy induced hypertension     Patient Active Problem List   Diagnosis Date Noted  . Dysuria 02/17/2020  . GERD (gastroesophageal reflux disease) 12/09/2019  . Preventative health care 10/29/2019  . GAD (generalized anxiety disorder) 10/29/2019  . Body mass index (BMI) of 36.0-36.9 in adult 09/24/2019  . Chronic cough 09/24/2019    Past Surgical History:  Procedure Laterality Date  . CESAREAN SECTION    . DIAGNOSTIC LAPAROSCOPY WITH REMOVAL OF ECTOPIC PREGNANCY Left 04/17/2018   Procedure: DIAGNOSTIC LAPAROSCOPY WITH REMOVAL OF ECTOPIC PREGNANCY;  Surgeon: Allie Bossier, MD;  Location: [facility];  Service: Gynecology;  Laterality: Left;  . LAPAROSCOPIC UNILATERAL SALPINGECTOMY  04/17/2018   Procedure: LAPAROSCOPIC LEFT SALPINGECTOMY;  Surgeon: Allie Bossier, MD;  Location: [facility] OR[facility];  Service: Gynecology;;  . TONSILLECTOMY       OB History    Gravida  3   Para  1   Term  1   Preterm      AB  1   Living        SAB  1   TAB      Ectopic      Multiple      Live Births              History reviewed. No pertinent family  history.  Social History   Tobacco Use  . Smoking status: Former Smoker    Packs/day: 1.00    Years: 4.00    Pack years: 4.00    Types: Cigarettes  . Smokeless tobacco: Former Neurosurgeon  . Tobacco comment: Quit 2018  Vaping Use  . Vaping Use: Never used  Substance Use Topics  . Alcohol use: No  . Drug use: No    Home Medications Prior to Admission medications   Medication Sig Start Date End Date Taking? Authorizing Provider  busPIRone (BUSPAR) 10 MG tablet Take 1 tablet (10 mg total) by mouth 2 (two) times daily. 02/20/20   Allayne Stack, DO  escitalopram (LEXAPRO) 10 MG tablet Take 1 tablet (10 mg total) by mouth daily. Start with 1/2 tablet for first week 10/30/19   Allayne Stack, DO  hydrOXYzine (ATARAX/VISTARIL) 25 MG tablet Take 1 tablet (25 mg total) by mouth 3 (three) times daily as needed for anxiety. 11/18/19   Allayne Stack, DO  norethindrone (MICRONOR,CAMILA,ERRIN) 0.35 MG tablet 1 tab po daily at the same every day. 05/31/18    Bing, MD  pantoprazole (PROTONIX) 40 MG tablet Take 1 tablet (40 mg total) by mouth daily. 10/28/19  Leticia Penna N, DO  sertraline (ZOLOFT) 50 MG tablet Take 1 tablet (50 mg total) by mouth daily. 10/28/19   Allayne Stack, DO    Allergies    Latex  Review of Systems   Review of Systems Ten systems reviewed and are negative for acute change, except as noted in the HPI.   Physical Exam Updated Vital Signs BP (!) 147/90   Pulse 92   Temp 98.1 F (36.7 C) (Oral)   Resp 15   Ht 5\' 6"  (1.676 m)   Wt 104.3 kg   SpO2 100%   BMI 37.12 kg/m   Physical Exam Vitals and nursing note reviewed.  Constitutional:      General: She is not in acute distress.    Appearance: She is well-developed. She is not diaphoretic.  HENT:     Head: Normocephalic and atraumatic.  Eyes:     General: No scleral icterus.    Conjunctiva/sclera: Conjunctivae normal.  Cardiovascular:     Rate and Rhythm: Normal rate and regular rhythm.       Heart sounds: Normal heart sounds. No murmur heard.  No friction rub. No gallop.   Pulmonary:     Effort: Pulmonary effort is normal. No respiratory distress.     Breath sounds: Normal breath sounds.  Abdominal:     General: Bowel sounds are normal. There is no distension.     Palpations: Abdomen is soft. There is no mass.     Tenderness: There is no abdominal tenderness. There is no guarding.  Genitourinary:    Exam position: Lithotomy position.     Pubic Area: No rash.      Labia:        Right: No rash, tenderness, lesion or injury.        Left: No rash, tenderness, lesion or injury.      Vagina: Normal.     Cervix: Cervical bleeding present. No cervical motion tenderness, discharge, friability, lesion, erythema or eversion.     Uterus: Normal.      Adnexa: Right adnexa normal and left adnexa normal.  Musculoskeletal:     Cervical back: Normal range of motion.  Skin:    General: Skin is warm and dry.  Neurological:     Mental Status: She is alert and oriented to person, place, and time.  Psychiatric:        Behavior: Behavior normal.     ED Results / Procedures / Treatments   Labs (all labs ordered are listed, but only abnormal results are displayed) Labs Reviewed  WET PREP, GENITAL - Abnormal; Notable for the following components:      Result Value   WBC, Wet Prep HPF POC FEW (*)    All other components within normal limits  URINALYSIS, ROUTINE W REFLEX MICROSCOPIC - Abnormal; Notable for the following components:   Hgb urine dipstick LARGE (*)    RBC / HPF >50 (*)    Bacteria, UA RARE (*)    All other components within normal limits  CBC WITH DIFFERENTIAL/PLATELET - Abnormal; Notable for the following components:   WBC 11.2 (*)    Platelets 401 (*)    All other components within normal limits  BASIC METABOLIC PANEL  I-STAT BETA HCG BLOOD, ED ([facility], [facility], [facility] ONLY)  I-STAT BETA HCG BLOOD, ED ([facility], [facility], [facility] ONLY)  GC/CHLAMYDIA PROBE AMP ([place]) NOT AT [facility]     EKG None  Radiology No results found.  Procedures Procedures (including critical care time)  Medications Ordered in ED Medications - No data to display  ED Course  I have reviewed the triage vital signs and the nursing notes.  Pertinent labs & imaging results that were available during my care of the patient were reviewed by me and considered in my medical decision making (see chart for details).    MDM Rules/Calculators/A&P                           Patient with AUB. Differential diagnosis for nonpregnant vaginal bleeding includes but is not limited to systemic causes such as cirrhosis of the liver, coagulopathy such as ITP or von Willebrand's disease, strep vaginitis, HRT, hypothyroidism, secondary anovulation.  Reproductive tract causes include adenomyosis, atrophic endometrium, dysfunctional uterine bleeding, endometriosis, fibroids, foreign body, infection, IUD, neoplasia or vaginal trauma. I ordered CBC.,  BMP with.  Urine appears contaminated with blood.  No evidence of acute UTI.  Wet pat positive for white blood cell count however no CMT or cervical discharge.  Will discharge and follow closely with OB/GYN.  She has close follow-up appointment at the end of December.  Discussed return precautions. Final Clinical Impression(s) / ED Diagnoses Final diagnoses:  None    Rx / DC Orders ED Discharge Orders    None       Arthor Captain, PA-C 04/07/20 Marcie Mowers, MD 04/09/20 [phone]

## 2020-04-07 NOTE — ED Triage Notes (Signed)
Patient arrives to ED with complaints of vaginal bleeding x2 weeks. Pt states over the past two weeks she has been spotting and today she had a large amount of blood pass. Pt states she started cramping today and is nauseous.

## 2020-04-07 NOTE — Discharge Instructions (Addendum)
Your work-up was negative for any significant abnormalities.  Please follow-up in the emergency department if you are soaking through more than 1 overnight pad an hour or you feel like you are going to pass out or become markedly short of breath.  Contact a health care provider if: Your bleeding lasts for more than one week. You feel dizzy at times. You feel nauseous or you vomit. Get help right away if: You pass out. Your bleeding soaks through a pad every hour. You have abdominal pain. You have a fever. You become sweaty or weak. You pass large blood clots from your vagina.

## 2020-04-08 LAB — GC/CHLAMYDIA PROBE AMP (~~LOC~~) NOT AT ARMC
Chlamydia: NEGATIVE
Comment: NEGATIVE
Comment: NORMAL
Neisseria Gonorrhea: NEGATIVE

## 2021-10-12 ENCOUNTER — Encounter: Payer: Self-pay | Admitting: *Deleted

## 2021-11-19 ENCOUNTER — Encounter (HOSPITAL_COMMUNITY): Payer: Self-pay

## 2021-11-19 ENCOUNTER — Ambulatory Visit (HOSPITAL_COMMUNITY)
Admission: RE | Admit: 2021-11-19 | Discharge: 2021-11-19 | Disposition: A | Discharge status: Home / Self Care | Payer: Medicaid Other | Source: Ambulatory Visit | Attending: Internal Medicine | Admitting: Internal Medicine

## 2021-11-19 VITALS — BP 141/95 | HR 82 | Temp 98.5°F | Resp 18

## 2021-11-19 DIAGNOSIS — R300 Dysuria: Secondary | ICD-10-CM

## 2021-11-19 DIAGNOSIS — A749 Chlamydial infection, unspecified: Secondary | ICD-10-CM | POA: Diagnosis not present

## 2021-11-19 DIAGNOSIS — A5901 Trichomonal vulvovaginitis: Secondary | ICD-10-CM | POA: Insufficient documentation

## 2021-11-19 LAB — POCT URINALYSIS DIPSTICK, ED / UC
Bilirubin Urine: NEGATIVE
Glucose, UA: NEGATIVE mg/dL
Ketones, ur: NEGATIVE mg/dL
Leukocytes,Ua: NEGATIVE
Nitrite: NEGATIVE
Protein, ur: NEGATIVE mg/dL
Specific Gravity, Urine: 1.02 (ref 1.005–1.030)
Urobilinogen, UA: 0.2 mg/dL (ref 0.0–1.0)
pH: 7 (ref 5.0–8.0)

## 2021-11-19 NOTE — ED Triage Notes (Signed)
Pt reports that had intercourse and used condom 4 different times and allergic to latex. Reports having vaginal burning with urination for about week. Trying to drink lots of water and AZO.

## 2021-11-19 NOTE — Discharge Instructions (Addendum)
Your urine does not show signs of infection.  I suspect that you may have been having allergic reaction to latex given your explanation.  If symptoms persist or return, please follow-up.  Your vaginal swab that tests for bacterial vaginosis, vaginal yeast, STDs is pending.  We will call if it is abnormal and treat as appropriate.  Please refrain from sexual activity until test results and treatment are complete.

## 2021-11-19 NOTE — ED Provider Notes (Addendum)
[facility]    CSN: [id number] Arrival date & time: 11/19/21  6160      History   Chief Complaint Chief Complaint  Patient presents with   appt 9   Dysuria    HPI Jennifer Harding is a 26 y.o. female.   Patient presents with approximately 1 week history of vaginal irritation and burning upon urination.  Patient is attributing symptoms to using latex condoms during sexual intercourse as she has an allergy to latex.  Although, she reports that she did not have any latex free condoms available at the time so she used regular condoms.  She states that symptoms have now resolved but she wants to be sure that she does not have a urinary tract infection.  Symptoms resolved about 1 to 2 days ago.  Denies any associated vaginal discharge, hematuria, abdominal pain, pelvic pain, back pain, fever.  Patient has taken Azo with improvement in symptoms.  Denies any concern or exposure to STD. Last menstrual cycle was about a week ago.    Dysuria   Past Medical History:  Diagnosis Date   Hematuria    Hypertension    Left tubal pregnancy without intrauterine pregnancy 04/17/2018   Pregnancy induced hypertension     Patient Active Problem List   Diagnosis Date Noted   Dysuria 02/17/2020   GERD (gastroesophageal reflux disease) 12/09/2019   Preventative health care 10/29/2019   GAD (generalized anxiety disorder) 10/29/2019   Body mass index (BMI) of 36.0-36.9 in adult 09/24/2019   Chronic cough 09/24/2019    Past Surgical History:  Procedure Laterality Date   CESAREAN SECTION     DIAGNOSTIC LAPAROSCOPY WITH REMOVAL OF ECTOPIC PREGNANCY Left 04/17/2018   Procedure: DIAGNOSTIC LAPAROSCOPY WITH REMOVAL OF ECTOPIC PREGNANCY;  Surgeon: Allie Bossier, MD;  Location: [facility];  Service: Gynecology;  Laterality: Left;   LAPAROSCOPIC UNILATERAL SALPINGECTOMY  04/17/2018   Procedure: LAPAROSCOPIC LEFT SALPINGECTOMY;  Surgeon: Allie Bossier, MD;  Location: [facility];  Service: Gynecology;;    TONSILLECTOMY      OB History     Gravida  3   Para  1   Term  1   Preterm      AB  1   Living         SAB  1   IAB      Ectopic      Multiple      Live Births               Home Medications    Prior to Admission medications   Medication Sig Start Date End Date Taking? Authorizing Provider  busPIRone (BUSPAR) 10 MG tablet Take 1 tablet (10 mg total) by mouth 2 (two) times daily. 02/20/20   Allayne Stack, DO  escitalopram (LEXAPRO) 10 MG tablet Take 1 tablet (10 mg total) by mouth daily. Start with 1/2 tablet for first week 10/30/19   Allayne Stack, DO  hydrOXYzine (ATARAX/VISTARIL) 25 MG tablet Take 1 tablet (25 mg total) by mouth 3 (three) times daily as needed for anxiety. 11/18/19   Allayne Stack, DO  norethindrone (MICRONOR,CAMILA,ERRIN) 0.35 MG tablet 1 tab po daily at the same every day. 05/31/18   Airmont Bing, MD  pantoprazole (PROTONIX) 40 MG tablet Take 1 tablet (40 mg total) by mouth daily. 10/28/19   Allayne Stack, DO  sertraline (ZOLOFT) 50 MG tablet Take 1 tablet (50 mg total) by mouth daily. 10/28/19   Allayne Stack, DO  Family History No family history on file.  Social History Social History   Tobacco Use   Smoking status: Former    Packs/day: 1.00    Years: 4.00    Total pack years: 4.00    Types: Cigarettes   Smokeless tobacco: Former   Tobacco comments:    Quit 2018  Vaping Use   Vaping Use: Never used  Substance Use Topics   Alcohol use: No   Drug use: No     Allergies   Latex   Review of Systems Review of Systems Per HPI  Physical Exam Triage Vital Signs ED Triage Vitals  Enc Vitals Group     BP 11/19/21 0936 (!) 141/95     Pulse Rate 11/19/21 0936 82     Resp 11/19/21 0936 18     Temp 11/19/21 0936 98.5 F (36.9 C)     Temp Source 11/19/21 0936 Oral     SpO2 11/19/21 0936 96 %     Weight --      Height --      Head Circumference --      Peak Flow --      Pain Score 11/19/21 0934  3     Pain Loc --      Pain Edu? --      Excl. in GC? --    No data found.  Updated Vital Signs BP (!) 141/95 (BP Location: Right Arm)   Pulse 82   Temp 98.5 F (36.9 C) (Oral)   Resp 18   LMP 11/14/2021   SpO2 96%   Visual Acuity Right Eye Distance:   Left Eye Distance:   Bilateral Distance:    Right Eye Near:   Left Eye Near:    Bilateral Near:     Physical Exam Constitutional:      General: She is not in acute distress.    Appearance: Normal appearance. She is not toxic-appearing or diaphoretic.  HENT:     Head: Normocephalic and atraumatic.  Eyes:     Extraocular Movements: Extraocular movements intact.     Conjunctiva/sclera: Conjunctivae normal.  Cardiovascular:     Rate and Rhythm: Normal rate and regular rhythm.     Pulses: Normal pulses.     Heart sounds: Normal heart sounds.  Pulmonary:     Effort: Pulmonary effort is normal. No respiratory distress.     Breath sounds: Normal breath sounds.  Abdominal:     General: Bowel sounds are normal. There is no distension.     Palpations: Abdomen is soft.     Tenderness: There is no abdominal tenderness.  Genitourinary:    Comments: Patient declined.  Self swab performed. Neurological:     General: No focal deficit present.     Mental Status: She is alert and oriented to person, place, and time. Mental status is at baseline.  Psychiatric:        Mood and Affect: Mood normal.        Behavior: Behavior normal.        Thought Content: Thought content normal.        Judgment: Judgment normal.      UC Treatments / Results  Labs (all labs ordered are listed, but only abnormal results are displayed) Labs Reviewed  POCT URINALYSIS DIPSTICK, ED / UC - Abnormal; Notable for the following components:      Result Value   Hgb urine dipstick TRACE (*)    All other components within normal limits  CERVICOVAGINAL  ANCILLARY ONLY    EKG   Radiology No results found.  Procedures Procedures (including critical  care time)  Medications Ordered in UC Medications - No data to display  Initial Impression / Assessment and Plan / UC Course  I have reviewed the triage vital signs and the nursing notes.  Pertinent labs & imaging results that were available during my care of the patient were reviewed by me and considered in my medical decision making (see chart for details).     Highly suspicions that patient could have been having an allergic reaction to latex that caused symptoms.  Symptoms have now resolved so no further treatment at this time.  UA was unremarkable.  Will send cervicovaginal swab to ensure that vaginitis is not cause of symptoms.  Patient was advised to follow-up if symptoms persist or worsen.  Patient verbalized understanding and was agreeable with plan. Final Clinical Impressions(s) / UC Diagnoses   Final diagnoses:  Dysuria     Discharge Instructions      Your urine does not show signs of infection.  I suspect that you may have been having allergic reaction to latex given your explanation.  If symptoms persist or return, please follow-up.  Your vaginal swab that tests for bacterial vaginosis, vaginal yeast, STDs is pending.  We will call if it is abnormal and treat as appropriate.  Please refrain from sexual activity until test results and treatment are complete.    ED Prescriptions   None    PDMP not reviewed this encounter.   Gustavus Bryant, [place] 11/19/21 1014    [address], [place] 11/19/21 1014

## 2021-11-22 LAB — CERVICOVAGINAL ANCILLARY ONLY
Bacterial Vaginitis (gardnerella): NEGATIVE
Candida Glabrata: NEGATIVE
Candida Vaginitis: NEGATIVE
Chlamydia: POSITIVE — AB
Comment: NEGATIVE
Comment: NEGATIVE
Comment: NEGATIVE
Comment: NEGATIVE
Comment: NEGATIVE
Comment: NORMAL
Neisseria Gonorrhea: NEGATIVE
Trichomonas: POSITIVE — AB

## 2021-11-23 ENCOUNTER — Telehealth (HOSPITAL_COMMUNITY): Payer: Self-pay | Admitting: Emergency Medicine

## 2021-11-23 MED ORDER — DOXYCYCLINE HYCLATE 100 MG PO CAPS: 100.0000 mg | ORAL_CAPSULE | Freq: Two times a day (BID) | ORAL | 0 refills | Status: AC

## 2021-11-23 MED ORDER — METRONIDAZOLE 500 MG PO TABS: 500.0000 mg | ORAL_TABLET | Freq: Two times a day (BID) | ORAL | 0 refills | Status: AC

## 2021-11-29 ENCOUNTER — Encounter: Payer: Medicaid Other | Admitting: Student
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
